# Patient Record
Sex: Female | Born: 1985 | Race: White | Hispanic: Yes | Marital: Married | Smoking: Former smoker
Health system: Southern US, Community
[De-identification: ages and names within clinical notes are randomized; demographics above are authoritative.]

## PROBLEM LIST (undated history)

## (undated) DIAGNOSIS — K589 Irritable bowel syndrome without diarrhea: Secondary | ICD-10-CM

## (undated) DIAGNOSIS — G43909 Migraine, unspecified, not intractable, without status migrainosus: Secondary | ICD-10-CM

## (undated) DIAGNOSIS — M87052 Idiopathic aseptic necrosis of left femur: Secondary | ICD-10-CM

## (undated) DIAGNOSIS — E559 Vitamin D deficiency, unspecified: Secondary | ICD-10-CM

## (undated) DIAGNOSIS — E538 Deficiency of other specified B group vitamins: Secondary | ICD-10-CM

## (undated) DIAGNOSIS — M222X2 Patellofemoral disorders, left knee: Secondary | ICD-10-CM

## (undated) DIAGNOSIS — I499 Cardiac arrhythmia, unspecified: Secondary | ICD-10-CM

## (undated) DIAGNOSIS — R109 Unspecified abdominal pain: Secondary | ICD-10-CM

## (undated) DIAGNOSIS — G5620 Lesion of ulnar nerve, unspecified upper limb: Secondary | ICD-10-CM

## (undated) DIAGNOSIS — M797 Fibromyalgia: Secondary | ICD-10-CM

## (undated) DIAGNOSIS — E785 Hyperlipidemia, unspecified: Secondary | ICD-10-CM

## (undated) DIAGNOSIS — M1990 Unspecified osteoarthritis, unspecified site: Secondary | ICD-10-CM

## (undated) DIAGNOSIS — Z87442 Personal history of urinary calculi: Secondary | ICD-10-CM

## (undated) DIAGNOSIS — J189 Pneumonia, unspecified organism: Secondary | ICD-10-CM

## (undated) DIAGNOSIS — M222X1 Patellofemoral disorders, right knee: Secondary | ICD-10-CM

## (undated) DIAGNOSIS — M5481 Occipital neuralgia: Secondary | ICD-10-CM

## (undated) DIAGNOSIS — R413 Other amnesia: Secondary | ICD-10-CM

## (undated) DIAGNOSIS — K219 Gastro-esophageal reflux disease without esophagitis: Secondary | ICD-10-CM

## (undated) DIAGNOSIS — G4700 Insomnia, unspecified: Secondary | ICD-10-CM

## (undated) DIAGNOSIS — I951 Orthostatic hypotension: Secondary | ICD-10-CM

## (undated) DIAGNOSIS — I471 Supraventricular tachycardia: Secondary | ICD-10-CM

## (undated) DIAGNOSIS — N9419 Other specified dyspareunia: Secondary | ICD-10-CM

## (undated) DIAGNOSIS — M7580 Other shoulder lesions, unspecified shoulder: Secondary | ICD-10-CM

## (undated) DIAGNOSIS — N809 Endometriosis, unspecified: Secondary | ICD-10-CM

## (undated) DIAGNOSIS — N200 Calculus of kidney: Secondary | ICD-10-CM

## (undated) DIAGNOSIS — M357 Hypermobility syndrome: Secondary | ICD-10-CM

## (undated) DIAGNOSIS — Z8614 Personal history of Methicillin resistant Staphylococcus aureus infection: Secondary | ICD-10-CM

## (undated) DIAGNOSIS — G909 Disorder of the autonomic nervous system, unspecified: Secondary | ICD-10-CM

## (undated) DIAGNOSIS — C801 Malignant (primary) neoplasm, unspecified: Secondary | ICD-10-CM

## (undated) HISTORY — DX: Patellofemoral disorders, right knee: M22.2X1

## (undated) HISTORY — DX: Unspecified abdominal pain: R10.9

## (undated) HISTORY — DX: Calculus of kidney: N20.0

## (undated) HISTORY — DX: Insomnia, unspecified: G47.00

## (undated) HISTORY — PX: WISDOM TOOTH EXTRACTION: SHX21

## (undated) HISTORY — DX: Migraine, unspecified, not intractable, without status migrainosus: G43.909

## (undated) HISTORY — DX: Other shoulder lesions, unspecified shoulder: M75.80

## (undated) HISTORY — DX: Gastro-esophageal reflux disease without esophagitis: K21.9

## (undated) HISTORY — DX: Patellofemoral disorders, left knee: M22.2X2

## (undated) HISTORY — PX: OTHER SURGICAL HISTORY: SHX169

## (undated) HISTORY — PX: ABDOMINAL SURGERY: SHX537

## (undated) HISTORY — DX: Lesion of ulnar nerve, unspecified upper limb: G56.20

## (undated) HISTORY — PX: TYMPANOPLASTY: SHX33

## (undated) HISTORY — DX: Deficiency of other specified B group vitamins: E53.8

## (undated) HISTORY — DX: Other amnesia: R41.3

## (undated) HISTORY — DX: Hyperlipidemia, unspecified: E78.5

## (undated) HISTORY — DX: Vitamin D deficiency, unspecified: E55.9

## (undated) HISTORY — DX: Occipital neuralgia: M54.81

## (undated) HISTORY — DX: Endometriosis, unspecified: N80.9

## (undated) HISTORY — DX: Supraventricular tachycardia: I47.1

## (undated) HISTORY — PX: TUBAL LIGATION: SHX77

## (undated) HISTORY — PX: PORTA CATH INSERTION: CATH118285

## (undated) HISTORY — DX: Other specified dyspareunia: N94.19

## (undated) HISTORY — DX: Irritable bowel syndrome, unspecified: K58.9

---

## 2010-05-13 HISTORY — PX: PELVIC LAPAROSCOPY: SHX162

## 2011-05-14 HISTORY — PX: NASAL SEPTUM SURGERY: SHX37

## 2011-05-20 DIAGNOSIS — R002 Palpitations: Secondary | ICD-10-CM | POA: Insufficient documentation

## 2011-05-20 DIAGNOSIS — R079 Chest pain, unspecified: Secondary | ICD-10-CM | POA: Insufficient documentation

## 2011-05-20 DIAGNOSIS — R0602 Shortness of breath: Secondary | ICD-10-CM | POA: Insufficient documentation

## 2011-10-23 DIAGNOSIS — N971 Female infertility of tubal origin: Secondary | ICD-10-CM | POA: Insufficient documentation

## 2012-10-29 DIAGNOSIS — N200 Calculus of kidney: Secondary | ICD-10-CM | POA: Insufficient documentation

## 2014-09-07 DIAGNOSIS — K760 Fatty (change of) liver, not elsewhere classified: Secondary | ICD-10-CM | POA: Insufficient documentation

## 2015-05-14 DIAGNOSIS — R109 Unspecified abdominal pain: Secondary | ICD-10-CM

## 2015-05-14 DIAGNOSIS — I4719 Other supraventricular tachycardia: Secondary | ICD-10-CM

## 2015-05-14 DIAGNOSIS — G5620 Lesion of ulnar nerve, unspecified upper limb: Secondary | ICD-10-CM

## 2015-05-14 DIAGNOSIS — M5481 Occipital neuralgia: Secondary | ICD-10-CM

## 2015-05-14 DIAGNOSIS — I471 Supraventricular tachycardia: Secondary | ICD-10-CM

## 2015-05-14 DIAGNOSIS — M7580 Other shoulder lesions, unspecified shoulder: Secondary | ICD-10-CM

## 2015-05-14 HISTORY — DX: Other supraventricular tachycardia: I47.19

## 2015-05-14 HISTORY — DX: Other shoulder lesions, unspecified shoulder: M75.80

## 2015-05-14 HISTORY — DX: Supraventricular tachycardia: I47.1

## 2015-05-14 HISTORY — DX: Lesion of ulnar nerve, unspecified upper limb: G56.20

## 2015-05-14 HISTORY — DX: Unspecified abdominal pain: R10.9

## 2015-05-14 HISTORY — DX: Occipital neuralgia: M54.81

## 2015-07-05 DIAGNOSIS — E785 Hyperlipidemia, unspecified: Secondary | ICD-10-CM | POA: Insufficient documentation

## 2015-07-05 DIAGNOSIS — I471 Supraventricular tachycardia: Secondary | ICD-10-CM | POA: Insufficient documentation

## 2015-07-05 DIAGNOSIS — K219 Gastro-esophageal reflux disease without esophagitis: Secondary | ICD-10-CM | POA: Insufficient documentation

## 2015-07-05 DIAGNOSIS — K589 Irritable bowel syndrome without diarrhea: Secondary | ICD-10-CM | POA: Insufficient documentation

## 2015-07-05 DIAGNOSIS — N809 Endometriosis, unspecified: Secondary | ICD-10-CM | POA: Insufficient documentation

## 2015-07-05 DIAGNOSIS — J45909 Unspecified asthma, uncomplicated: Secondary | ICD-10-CM | POA: Insufficient documentation

## 2015-07-05 DIAGNOSIS — R911 Solitary pulmonary nodule: Secondary | ICD-10-CM | POA: Insufficient documentation

## 2015-07-05 DIAGNOSIS — M129 Arthropathy, unspecified: Secondary | ICD-10-CM | POA: Insufficient documentation

## 2015-07-05 DIAGNOSIS — F419 Anxiety disorder, unspecified: Secondary | ICD-10-CM | POA: Insufficient documentation

## 2015-07-07 DIAGNOSIS — M25562 Pain in left knee: Secondary | ICD-10-CM

## 2015-07-07 DIAGNOSIS — G8929 Other chronic pain: Secondary | ICD-10-CM | POA: Insufficient documentation

## 2015-08-10 DIAGNOSIS — J019 Acute sinusitis, unspecified: Secondary | ICD-10-CM | POA: Insufficient documentation

## 2015-08-13 DIAGNOSIS — S8002XA Contusion of left knee, initial encounter: Secondary | ICD-10-CM | POA: Diagnosis not present

## 2015-08-15 DIAGNOSIS — M25511 Pain in right shoulder: Secondary | ICD-10-CM | POA: Diagnosis not present

## 2015-08-15 DIAGNOSIS — G5621 Lesion of ulnar nerve, right upper limb: Secondary | ICD-10-CM | POA: Diagnosis not present

## 2015-08-15 DIAGNOSIS — M25562 Pain in left knee: Secondary | ICD-10-CM | POA: Diagnosis not present

## 2015-08-15 DIAGNOSIS — G8929 Other chronic pain: Secondary | ICD-10-CM | POA: Diagnosis not present

## 2015-08-17 DIAGNOSIS — R918 Other nonspecific abnormal finding of lung field: Secondary | ICD-10-CM | POA: Diagnosis not present

## 2015-08-21 DIAGNOSIS — M25522 Pain in left elbow: Secondary | ICD-10-CM | POA: Diagnosis not present

## 2015-08-23 DIAGNOSIS — S5002XA Contusion of left elbow, initial encounter: Secondary | ICD-10-CM | POA: Diagnosis not present

## 2015-10-26 DIAGNOSIS — R112 Nausea with vomiting, unspecified: Secondary | ICD-10-CM | POA: Diagnosis not present

## 2015-10-26 DIAGNOSIS — M545 Low back pain: Secondary | ICD-10-CM | POA: Diagnosis not present

## 2015-10-26 DIAGNOSIS — N12 Tubulo-interstitial nephritis, not specified as acute or chronic: Secondary | ICD-10-CM | POA: Diagnosis not present

## 2015-10-26 DIAGNOSIS — R300 Dysuria: Secondary | ICD-10-CM | POA: Diagnosis not present

## 2015-10-29 DIAGNOSIS — B9689 Other specified bacterial agents as the cause of diseases classified elsewhere: Secondary | ICD-10-CM | POA: Diagnosis not present

## 2015-10-29 DIAGNOSIS — J45909 Unspecified asthma, uncomplicated: Secondary | ICD-10-CM | POA: Diagnosis not present

## 2015-10-29 DIAGNOSIS — M797 Fibromyalgia: Secondary | ICD-10-CM | POA: Diagnosis not present

## 2015-10-29 DIAGNOSIS — I4589 Other specified conduction disorders: Secondary | ICD-10-CM | POA: Diagnosis not present

## 2015-10-29 DIAGNOSIS — J329 Chronic sinusitis, unspecified: Secondary | ICD-10-CM | POA: Diagnosis not present

## 2015-10-29 DIAGNOSIS — Z91018 Allergy to other foods: Secondary | ICD-10-CM | POA: Diagnosis not present

## 2015-10-29 DIAGNOSIS — R1031 Right lower quadrant pain: Secondary | ICD-10-CM | POA: Diagnosis not present

## 2015-10-29 DIAGNOSIS — R300 Dysuria: Secondary | ICD-10-CM | POA: Diagnosis not present

## 2015-10-29 DIAGNOSIS — B373 Candidiasis of vulva and vagina: Secondary | ICD-10-CM | POA: Diagnosis not present

## 2015-10-29 DIAGNOSIS — N760 Acute vaginitis: Secondary | ICD-10-CM | POA: Diagnosis not present

## 2015-10-29 DIAGNOSIS — K589 Irritable bowel syndrome without diarrhea: Secondary | ICD-10-CM | POA: Diagnosis not present

## 2015-10-29 DIAGNOSIS — Z91048 Other nonmedicinal substance allergy status: Secondary | ICD-10-CM | POA: Diagnosis not present

## 2015-10-29 DIAGNOSIS — N200 Calculus of kidney: Secondary | ICD-10-CM | POA: Diagnosis not present

## 2015-10-29 DIAGNOSIS — Z793 Long term (current) use of hormonal contraceptives: Secondary | ICD-10-CM | POA: Diagnosis not present

## 2015-10-29 DIAGNOSIS — E785 Hyperlipidemia, unspecified: Secondary | ICD-10-CM | POA: Diagnosis not present

## 2015-10-29 DIAGNOSIS — F329 Major depressive disorder, single episode, unspecified: Secondary | ICD-10-CM | POA: Diagnosis not present

## 2015-10-29 DIAGNOSIS — Z87891 Personal history of nicotine dependence: Secondary | ICD-10-CM | POA: Diagnosis not present

## 2015-10-29 DIAGNOSIS — F419 Anxiety disorder, unspecified: Secondary | ICD-10-CM | POA: Diagnosis not present

## 2015-12-04 DIAGNOSIS — R109 Unspecified abdominal pain: Secondary | ICD-10-CM | POA: Diagnosis not present

## 2015-12-04 DIAGNOSIS — R5383 Other fatigue: Secondary | ICD-10-CM | POA: Diagnosis not present

## 2015-12-04 DIAGNOSIS — R300 Dysuria: Secondary | ICD-10-CM | POA: Diagnosis not present

## 2015-12-07 DIAGNOSIS — R81 Glycosuria: Secondary | ICD-10-CM | POA: Diagnosis not present

## 2015-12-07 DIAGNOSIS — K7689 Other specified diseases of liver: Secondary | ICD-10-CM | POA: Diagnosis not present

## 2015-12-07 DIAGNOSIS — Z0000 Encounter for general adult medical examination without abnormal findings: Secondary | ICD-10-CM | POA: Diagnosis not present

## 2015-12-07 DIAGNOSIS — R300 Dysuria: Secondary | ICD-10-CM | POA: Diagnosis not present

## 2015-12-18 DIAGNOSIS — M898X8 Other specified disorders of bone, other site: Secondary | ICD-10-CM | POA: Diagnosis not present

## 2015-12-18 DIAGNOSIS — G8929 Other chronic pain: Secondary | ICD-10-CM | POA: Diagnosis not present

## 2015-12-18 DIAGNOSIS — M25561 Pain in right knee: Secondary | ICD-10-CM | POA: Diagnosis not present

## 2015-12-18 DIAGNOSIS — M222X1 Patellofemoral disorders, right knee: Secondary | ICD-10-CM | POA: Diagnosis not present

## 2015-12-18 DIAGNOSIS — M222X2 Patellofemoral disorders, left knee: Secondary | ICD-10-CM | POA: Diagnosis not present

## 2015-12-18 DIAGNOSIS — M25522 Pain in left elbow: Secondary | ICD-10-CM | POA: Diagnosis not present

## 2015-12-18 DIAGNOSIS — M25562 Pain in left knee: Secondary | ICD-10-CM | POA: Diagnosis not present

## 2016-01-22 DIAGNOSIS — R1031 Right lower quadrant pain: Secondary | ICD-10-CM | POA: Diagnosis not present

## 2016-01-22 DIAGNOSIS — Z793 Long term (current) use of hormonal contraceptives: Secondary | ICD-10-CM | POA: Diagnosis not present

## 2016-01-22 DIAGNOSIS — S0993XA Unspecified injury of face, initial encounter: Secondary | ICD-10-CM | POA: Diagnosis not present

## 2016-01-22 DIAGNOSIS — Z91048 Other nonmedicinal substance allergy status: Secondary | ICD-10-CM | POA: Diagnosis not present

## 2016-01-22 DIAGNOSIS — R109 Unspecified abdominal pain: Secondary | ICD-10-CM | POA: Diagnosis not present

## 2016-01-22 DIAGNOSIS — S0083XA Contusion of other part of head, initial encounter: Secondary | ICD-10-CM | POA: Diagnosis not present

## 2016-01-22 DIAGNOSIS — J45909 Unspecified asthma, uncomplicated: Secondary | ICD-10-CM | POA: Diagnosis not present

## 2016-01-22 DIAGNOSIS — S8992XA Unspecified injury of left lower leg, initial encounter: Secondary | ICD-10-CM | POA: Diagnosis not present

## 2016-01-22 DIAGNOSIS — Z91018 Allergy to other foods: Secondary | ICD-10-CM | POA: Diagnosis not present

## 2016-01-22 DIAGNOSIS — Q613 Polycystic kidney, unspecified: Secondary | ICD-10-CM | POA: Diagnosis not present

## 2016-01-22 DIAGNOSIS — N200 Calculus of kidney: Secondary | ICD-10-CM | POA: Diagnosis not present

## 2016-01-22 DIAGNOSIS — S8392XA Sprain of unspecified site of left knee, initial encounter: Secondary | ICD-10-CM | POA: Diagnosis not present

## 2016-01-22 DIAGNOSIS — S0033XA Contusion of nose, initial encounter: Secondary | ICD-10-CM | POA: Diagnosis not present

## 2016-01-22 DIAGNOSIS — Z87891 Personal history of nicotine dependence: Secondary | ICD-10-CM | POA: Diagnosis not present

## 2016-01-22 DIAGNOSIS — Z79899 Other long term (current) drug therapy: Secondary | ICD-10-CM | POA: Diagnosis not present

## 2016-01-22 DIAGNOSIS — K589 Irritable bowel syndrome without diarrhea: Secondary | ICD-10-CM | POA: Diagnosis not present

## 2016-01-22 DIAGNOSIS — E785 Hyperlipidemia, unspecified: Secondary | ICD-10-CM | POA: Diagnosis not present

## 2016-01-24 DIAGNOSIS — N200 Calculus of kidney: Secondary | ICD-10-CM | POA: Diagnosis not present

## 2016-01-24 DIAGNOSIS — J45909 Unspecified asthma, uncomplicated: Secondary | ICD-10-CM | POA: Diagnosis not present

## 2016-01-24 DIAGNOSIS — R109 Unspecified abdominal pain: Secondary | ICD-10-CM | POA: Diagnosis not present

## 2016-01-24 DIAGNOSIS — Z79899 Other long term (current) drug therapy: Secondary | ICD-10-CM | POA: Diagnosis not present

## 2016-01-24 DIAGNOSIS — E785 Hyperlipidemia, unspecified: Secondary | ICD-10-CM | POA: Diagnosis not present

## 2016-01-24 DIAGNOSIS — F329 Major depressive disorder, single episode, unspecified: Secondary | ICD-10-CM | POA: Diagnosis not present

## 2016-01-24 DIAGNOSIS — N189 Chronic kidney disease, unspecified: Secondary | ICD-10-CM | POA: Diagnosis not present

## 2016-01-24 DIAGNOSIS — R1031 Right lower quadrant pain: Secondary | ICD-10-CM | POA: Diagnosis not present

## 2016-01-24 DIAGNOSIS — F419 Anxiety disorder, unspecified: Secondary | ICD-10-CM | POA: Diagnosis not present

## 2016-01-24 DIAGNOSIS — Z87891 Personal history of nicotine dependence: Secondary | ICD-10-CM | POA: Diagnosis not present

## 2016-01-24 DIAGNOSIS — I878 Other specified disorders of veins: Secondary | ICD-10-CM | POA: Diagnosis not present

## 2016-01-24 DIAGNOSIS — Z91018 Allergy to other foods: Secondary | ICD-10-CM | POA: Diagnosis not present

## 2016-01-24 DIAGNOSIS — N390 Urinary tract infection, site not specified: Secondary | ICD-10-CM | POA: Diagnosis not present

## 2016-01-29 DIAGNOSIS — R1084 Generalized abdominal pain: Secondary | ICD-10-CM | POA: Diagnosis not present

## 2016-01-29 DIAGNOSIS — N200 Calculus of kidney: Secondary | ICD-10-CM | POA: Diagnosis not present

## 2016-02-08 DIAGNOSIS — M545 Low back pain: Secondary | ICD-10-CM | POA: Diagnosis not present

## 2016-02-08 DIAGNOSIS — Z79899 Other long term (current) drug therapy: Secondary | ICD-10-CM | POA: Diagnosis not present

## 2016-02-08 DIAGNOSIS — Z91018 Allergy to other foods: Secondary | ICD-10-CM | POA: Diagnosis not present

## 2016-02-08 DIAGNOSIS — E785 Hyperlipidemia, unspecified: Secondary | ICD-10-CM | POA: Diagnosis not present

## 2016-02-08 DIAGNOSIS — S46912A Strain of unspecified muscle, fascia and tendon at shoulder and upper arm level, left arm, initial encounter: Secondary | ICD-10-CM | POA: Diagnosis not present

## 2016-02-08 DIAGNOSIS — M797 Fibromyalgia: Secondary | ICD-10-CM | POA: Diagnosis not present

## 2016-02-08 DIAGNOSIS — Z888 Allergy status to other drugs, medicaments and biological substances status: Secondary | ICD-10-CM | POA: Diagnosis not present

## 2016-02-08 DIAGNOSIS — J45909 Unspecified asthma, uncomplicated: Secondary | ICD-10-CM | POA: Diagnosis not present

## 2016-02-08 DIAGNOSIS — F329 Major depressive disorder, single episode, unspecified: Secondary | ICD-10-CM | POA: Diagnosis not present

## 2016-02-08 DIAGNOSIS — M25562 Pain in left knee: Secondary | ICD-10-CM | POA: Diagnosis not present

## 2016-02-08 DIAGNOSIS — Z87891 Personal history of nicotine dependence: Secondary | ICD-10-CM | POA: Diagnosis not present

## 2016-02-08 DIAGNOSIS — F419 Anxiety disorder, unspecified: Secondary | ICD-10-CM | POA: Diagnosis not present

## 2016-02-08 DIAGNOSIS — M25512 Pain in left shoulder: Secondary | ICD-10-CM | POA: Diagnosis not present

## 2016-02-08 DIAGNOSIS — N189 Chronic kidney disease, unspecified: Secondary | ICD-10-CM | POA: Diagnosis not present

## 2016-02-22 DIAGNOSIS — Z87891 Personal history of nicotine dependence: Secondary | ICD-10-CM | POA: Diagnosis not present

## 2016-02-22 DIAGNOSIS — M25562 Pain in left knee: Secondary | ICD-10-CM | POA: Diagnosis not present

## 2016-02-22 DIAGNOSIS — S83412A Sprain of medial collateral ligament of left knee, initial encounter: Secondary | ICD-10-CM | POA: Diagnosis not present

## 2016-02-28 DIAGNOSIS — N200 Calculus of kidney: Secondary | ICD-10-CM | POA: Diagnosis not present

## 2016-03-01 DIAGNOSIS — N200 Calculus of kidney: Secondary | ICD-10-CM | POA: Diagnosis not present

## 2016-03-13 DIAGNOSIS — M549 Dorsalgia, unspecified: Secondary | ICD-10-CM | POA: Diagnosis not present

## 2016-03-13 DIAGNOSIS — R51 Headache: Secondary | ICD-10-CM | POA: Diagnosis not present

## 2016-03-13 DIAGNOSIS — M542 Cervicalgia: Secondary | ICD-10-CM | POA: Diagnosis not present

## 2016-05-14 DIAGNOSIS — Q649 Congenital malformation of urinary system, unspecified: Secondary | ICD-10-CM | POA: Diagnosis not present

## 2016-05-14 DIAGNOSIS — R300 Dysuria: Secondary | ICD-10-CM | POA: Diagnosis not present

## 2016-05-14 DIAGNOSIS — J0100 Acute maxillary sinusitis, unspecified: Secondary | ICD-10-CM | POA: Diagnosis not present

## 2016-05-20 DIAGNOSIS — R300 Dysuria: Secondary | ICD-10-CM | POA: Diagnosis not present

## 2016-05-20 DIAGNOSIS — R350 Frequency of micturition: Secondary | ICD-10-CM | POA: Diagnosis not present

## 2016-05-20 DIAGNOSIS — J019 Acute sinusitis, unspecified: Secondary | ICD-10-CM | POA: Diagnosis not present

## 2016-10-29 DIAGNOSIS — M25512 Pain in left shoulder: Secondary | ICD-10-CM | POA: Diagnosis not present

## 2016-11-12 DIAGNOSIS — M62838 Other muscle spasm: Secondary | ICD-10-CM | POA: Diagnosis not present

## 2016-11-12 DIAGNOSIS — M542 Cervicalgia: Secondary | ICD-10-CM | POA: Diagnosis not present

## 2016-11-12 DIAGNOSIS — M25512 Pain in left shoulder: Secondary | ICD-10-CM | POA: Diagnosis not present

## 2016-11-16 DIAGNOSIS — M50322 Other cervical disc degeneration at C5-C6 level: Secondary | ICD-10-CM | POA: Diagnosis not present

## 2016-11-16 DIAGNOSIS — M25512 Pain in left shoulder: Secondary | ICD-10-CM | POA: Diagnosis not present

## 2016-11-16 DIAGNOSIS — M542 Cervicalgia: Secondary | ICD-10-CM | POA: Diagnosis not present

## 2016-11-16 DIAGNOSIS — M50321 Other cervical disc degeneration at C4-C5 level: Secondary | ICD-10-CM | POA: Diagnosis not present

## 2016-11-26 DIAGNOSIS — M5020 Other cervical disc displacement, unspecified cervical region: Secondary | ICD-10-CM | POA: Diagnosis not present

## 2016-11-26 DIAGNOSIS — M4722 Other spondylosis with radiculopathy, cervical region: Secondary | ICD-10-CM | POA: Diagnosis not present

## 2016-11-26 DIAGNOSIS — M542 Cervicalgia: Secondary | ICD-10-CM | POA: Diagnosis not present

## 2016-11-26 DIAGNOSIS — M4712 Other spondylosis with myelopathy, cervical region: Secondary | ICD-10-CM | POA: Diagnosis not present

## 2016-12-02 DIAGNOSIS — M5412 Radiculopathy, cervical region: Secondary | ICD-10-CM | POA: Diagnosis not present

## 2016-12-24 DIAGNOSIS — M5020 Other cervical disc displacement, unspecified cervical region: Secondary | ICD-10-CM | POA: Diagnosis not present

## 2016-12-24 DIAGNOSIS — M4722 Other spondylosis with radiculopathy, cervical region: Secondary | ICD-10-CM | POA: Diagnosis not present

## 2016-12-24 DIAGNOSIS — M4712 Other spondylosis with myelopathy, cervical region: Secondary | ICD-10-CM | POA: Diagnosis not present

## 2016-12-24 DIAGNOSIS — M542 Cervicalgia: Secondary | ICD-10-CM | POA: Diagnosis not present

## 2017-01-27 DIAGNOSIS — M4722 Other spondylosis with radiculopathy, cervical region: Secondary | ICD-10-CM | POA: Diagnosis not present

## 2017-01-27 DIAGNOSIS — M4712 Other spondylosis with myelopathy, cervical region: Secondary | ICD-10-CM | POA: Diagnosis not present

## 2017-02-18 DIAGNOSIS — M5412 Radiculopathy, cervical region: Secondary | ICD-10-CM | POA: Diagnosis not present

## 2017-02-18 DIAGNOSIS — M4712 Other spondylosis with myelopathy, cervical region: Secondary | ICD-10-CM | POA: Diagnosis not present

## 2017-02-18 DIAGNOSIS — M542 Cervicalgia: Secondary | ICD-10-CM | POA: Diagnosis not present

## 2017-02-18 DIAGNOSIS — M4722 Other spondylosis with radiculopathy, cervical region: Secondary | ICD-10-CM | POA: Diagnosis not present

## 2017-02-28 DIAGNOSIS — M542 Cervicalgia: Secondary | ICD-10-CM | POA: Diagnosis not present

## 2017-03-07 DIAGNOSIS — M542 Cervicalgia: Secondary | ICD-10-CM | POA: Diagnosis not present

## 2017-03-17 DIAGNOSIS — M4712 Other spondylosis with myelopathy, cervical region: Secondary | ICD-10-CM | POA: Diagnosis not present

## 2017-03-17 DIAGNOSIS — M4722 Other spondylosis with radiculopathy, cervical region: Secondary | ICD-10-CM | POA: Diagnosis not present

## 2017-04-12 DIAGNOSIS — M542 Cervicalgia: Secondary | ICD-10-CM | POA: Diagnosis not present

## 2017-04-12 DIAGNOSIS — M5412 Radiculopathy, cervical region: Secondary | ICD-10-CM | POA: Diagnosis not present

## 2017-05-08 DIAGNOSIS — J45909 Unspecified asthma, uncomplicated: Secondary | ICD-10-CM | POA: Diagnosis not present

## 2017-05-08 DIAGNOSIS — W19XXXA Unspecified fall, initial encounter: Secondary | ICD-10-CM | POA: Diagnosis not present

## 2017-05-08 DIAGNOSIS — M797 Fibromyalgia: Secondary | ICD-10-CM | POA: Diagnosis not present

## 2017-05-08 DIAGNOSIS — S93401A Sprain of unspecified ligament of right ankle, initial encounter: Secondary | ICD-10-CM | POA: Diagnosis not present

## 2017-05-08 DIAGNOSIS — S93491A Sprain of other ligament of right ankle, initial encounter: Secondary | ICD-10-CM | POA: Diagnosis not present

## 2017-05-08 DIAGNOSIS — Z87891 Personal history of nicotine dependence: Secondary | ICD-10-CM | POA: Diagnosis not present

## 2017-05-08 DIAGNOSIS — Z79899 Other long term (current) drug therapy: Secondary | ICD-10-CM | POA: Diagnosis not present

## 2017-05-08 DIAGNOSIS — M25571 Pain in right ankle and joints of right foot: Secondary | ICD-10-CM | POA: Diagnosis not present

## 2017-05-08 DIAGNOSIS — F419 Anxiety disorder, unspecified: Secondary | ICD-10-CM | POA: Diagnosis not present

## 2017-05-08 DIAGNOSIS — W07XXXA Fall from chair, initial encounter: Secondary | ICD-10-CM | POA: Diagnosis not present

## 2017-05-08 DIAGNOSIS — Z91018 Allergy to other foods: Secondary | ICD-10-CM | POA: Diagnosis not present

## 2017-05-08 DIAGNOSIS — M25561 Pain in right knee: Secondary | ICD-10-CM | POA: Diagnosis not present

## 2017-05-08 DIAGNOSIS — Z79890 Hormone replacement therapy (postmenopausal): Secondary | ICD-10-CM | POA: Diagnosis not present

## 2017-05-08 DIAGNOSIS — Z91048 Other nonmedicinal substance allergy status: Secondary | ICD-10-CM | POA: Diagnosis not present

## 2017-05-08 DIAGNOSIS — F329 Major depressive disorder, single episode, unspecified: Secondary | ICD-10-CM | POA: Diagnosis not present

## 2017-05-15 ENCOUNTER — Ambulatory Visit (INDEPENDENT_AMBULATORY_CARE_PROVIDER_SITE_OTHER): Payer: BLUE CROSS/BLUE SHIELD | Admitting: Family Medicine

## 2017-05-15 ENCOUNTER — Encounter: Payer: Self-pay | Admitting: Family Medicine

## 2017-05-15 DIAGNOSIS — S99911A Unspecified injury of right ankle, initial encounter: Secondary | ICD-10-CM | POA: Diagnosis not present

## 2017-05-15 DIAGNOSIS — S8991XA Unspecified injury of right lower leg, initial encounter: Secondary | ICD-10-CM

## 2017-05-15 NOTE — Patient Instructions (Signed)
You can stop wearing the ankle brace.  You have a severe knee contusion with an effusion - concern is for a possible ACL tear when this occurs. We will go ahead with an MRI to further assess. In meantime icing 15 minutes at a time 3-4 times a day. Ibuprofen 600mg  three times a day with food OR aleve 2 tabs twice a day with food for pain and inflammation. Knee brace for support. Elevate above your heart when possible. I will call you with results and next steps.

## 2017-05-20 ENCOUNTER — Encounter: Payer: Self-pay | Admitting: Family Medicine

## 2017-05-20 DIAGNOSIS — S8991XD Unspecified injury of right lower leg, subsequent encounter: Secondary | ICD-10-CM | POA: Insufficient documentation

## 2017-05-20 DIAGNOSIS — M797 Fibromyalgia: Secondary | ICD-10-CM | POA: Insufficient documentation

## 2017-05-20 DIAGNOSIS — S99911A Unspecified injury of right ankle, initial encounter: Secondary | ICD-10-CM | POA: Insufficient documentation

## 2017-05-20 NOTE — Assessment & Plan Note (Signed)
noted effusion confirmed with ultrasound.  Noncontact injury - exam consistent with knee contusion with effusion.  Discussed high incidence of ACL tears with acute effusion following injury though even with normal exam.  Will go ahead with MRI to further assess.  Icing, ibuprofen or aleve.  Knee brace for support.

## 2017-05-20 NOTE — Progress Notes (Signed)
PCP: Madelaine Bhat., MD  Subjective:   HPI: Patient is a 32 y.o. female here for right knee and ankle pain.  Patient reports on 12/26 she was stepping down from a folding chair and jammed her right leg on the ground injuring her right ankle and knee Feels the ankle has improved - wearing ankle brace. Knee still hurts at 6-7/10 level, sharp with swelling. Difficulty sleeping, associated spasms. 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 Has been resting and icing. No skin changes, numbness.  History reviewed. No pertinent past medical history.  Current Outpatient Medications on File Prior to Visit  Medication Sig Dispense Refill  . omeprazole (PRILOSEC) 20 MG capsule Take by mouth.    . traZODone (DESYREL) 50 MG tablet 1-3 tabs q hs    . cyclobenzaprine (FLEXERIL) 10 MG tablet Take by mouth.     No current facility-administered medications on file prior to visit.     History reviewed. No pertinent surgical history.  Allergies  Allergen Reactions  . Strawberry Extract Hives    Social History   Socioeconomic History  . Marital status: Married    Spouse name: Not on file  . Number of children: Not on file  . Years of education: Not on file  . Highest education level: Not on file  Social Needs  . Financial resource strain: Not on file  . Food insecurity - worry: Not on file  . Food insecurity - inability: Not on file  . Transportation needs - medical: Not on file  . Transportation needs - non-medical: Not on file  Occupational History  . Not on file  Tobacco Use  . Smoking status: Never Smoker  . Smokeless tobacco: Never Used  Substance and Sexual Activity  . Alcohol use: Not on file  . Drug use: Not on file  . Sexual activity: Not on file  Other Topics Concern  . Not on file  Social History Narrative  . Not on file    History reviewed. No pertinent family history.  BP 115/76   Pulse 69   Ht 5\' 8"  (1.727 m)    Wt 147 lb (66.7 kg)   BMI 22.35 kg/m   Review of Systems: See HPI above.     Objective:  Physical Exam:  Gen: NAD, comfortable in exam room  Right knee: Mod effusion.  No bruising, other deformity. No TTP. ROM 0 - 120 degrees. Negative ant/post drawers. Negative valgus/varus testing. Negative lachmanns. Negative mcmurrays, apleys, patellar apprehension. NV intact distally.  Left knee: No gross deformity, ecchymoses, swelling. No TTP. FROM. Negative ant/post drawers. Negative valgus/varus testing. Negative lachmanns. Negative mcmurrays, apleys, patellar apprehension. NV intact distally.  Right ankle: No gross deformity, swelling, ecchymoses FROM with 5/5 strength. No TTP Negative ant drawer and talar tilt.   Negative syndesmotic compression. Thompsons test negative. NV intact distally.   Assessment & Plan:  1. Right knee injury - noted effusion confirmed with ultrasound.  Noncontact injury - exam consistent with knee contusion with effusion.  Discussed high incidence of ACL tears with acute effusion following injury though even with normal exam.  Will go ahead with MRI to further assess.  Icing, ibuprofen or aleve.  Knee brace for support.   2. Right ankle injury - exam reassuring - stop using brace.

## 2017-05-20 NOTE — Assessment & Plan Note (Signed)
exam reassuring - stop using brace.

## 2017-05-23 NOTE — Addendum Note (Signed)
Addended by: Sherrie George F on: 05/23/2017 12:35 PM   Modules accepted: Orders

## 2017-06-02 ENCOUNTER — Other Ambulatory Visit: Payer: BLUE CROSS/BLUE SHIELD

## 2017-06-17 DIAGNOSIS — S93602A Unspecified sprain of left foot, initial encounter: Secondary | ICD-10-CM | POA: Diagnosis not present

## 2017-06-19 ENCOUNTER — Ambulatory Visit (INDEPENDENT_AMBULATORY_CARE_PROVIDER_SITE_OTHER): Payer: BLUE CROSS/BLUE SHIELD | Admitting: Family Medicine

## 2017-06-19 ENCOUNTER — Encounter: Payer: Self-pay | Admitting: Family Medicine

## 2017-06-19 DIAGNOSIS — M79672 Pain in left foot: Secondary | ICD-10-CM | POA: Diagnosis not present

## 2017-06-19 NOTE — Assessment & Plan Note (Signed)
with swelling and bruising but unusual in that she does not recall an injury.  Exam and ultrasound consistent with small cuboid fracture.  Cam walker when up and walking around.  Weight bearing as tolerated.  Tylenol, ibuprofen.  Crutches if needed.  F/u in 2 weeks.  Expect 6 weeks for this to heal.

## 2017-06-19 NOTE — Patient Instructions (Signed)
You have a small irregularity of your cuboid suggestive of a fracture. These heal really well with conservative treatment over 6 weeks. Boot when up and walking around but ok to take this off to wash the area, ice it. Tylenol 500mg  1-2 tabs three times a day if needed. Ibuprofen 600mg  three times a day with food for pain and inflammation. It's ok to put weight on this in the boot. Can use crutches if needed as well. Follow up with me in 2 weeks for reevaluation.

## 2017-06-19 NOTE — Progress Notes (Signed)
PCP: Madelaine Bhat., MD  Subjective:   HPI: Patient is a 32 y.o. female here for left foot pain.  Patient denies known injury or trauma. She reports on 2/1 she woke up with pain, swelling, bruising dorsal left foot and anterolateral ankle. She had x-rays at an urgent care that were negative for fracture. Wearing postop shoe. Pain is 6/10 and sharp, worse with walking. No other skin changes, numbness.  History reviewed. No pertinent past medical history.  Current Outpatient Medications on File Prior to Visit  Medication Sig Dispense Refill  . cyclobenzaprine (FLEXERIL) 10 MG tablet Take by mouth.    Marland Kitchen omeprazole (PRILOSEC) 20 MG capsule Take by mouth.    . traZODone (DESYREL) 50 MG tablet 1-3 tabs q hs     No current facility-administered medications on file prior to visit.     History reviewed. No pertinent surgical history.  Allergies  Allergen Reactions  . Strawberry Extract Hives    Social History   Socioeconomic History  . Marital status: Married    Spouse name: Not on file  . Number of children: Not on file  . Years of education: Not on file  . Highest education level: Not on file  Social Needs  . Financial resource strain: Not on file  . Food insecurity - worry: Not on file  . Food insecurity - inability: Not on file  . Transportation needs - medical: Not on file  . Transportation needs - non-medical: Not on file  Occupational History  . Not on file  Tobacco Use  . Smoking status: Never Smoker  . Smokeless tobacco: Never Used  Substance and Sexual Activity  . Alcohol use: Not on file  . Drug use: Not on file  . Sexual activity: Not on file  Other Topics Concern  . Not on file  Social History Narrative  . Not on file    History reviewed. No pertinent family history.  BP 140/87   Pulse (!) 104   Ht 5\' 8"  (1.727 m)   Wt 156 lb (70.8 kg)   BMI 23.72 kg/m   Review of Systems: See HPI above.     Objective:  Physical Exam:  Gen: NAD,  comfortable in exam room  Left foot/ankle: Mild swelling dorsal foot and lateral ankle.  Bruising as well over these areas into left lateral heel and 4th, 5th digits. FROM ankle with 5/5 strength. TTP over cuboid, bases of 4th and 5th metatarsals. Negative ant drawer and talar tilt.   Negative syndesmotic compression. Painful metatarsal squeeze. Thompsons test negative. NV intact distally.  Right foot/ankle: No deformity. FROM with 5/5 strength. No tenderness to palpation. NVI distally.  MSK u/s left foot/ankle: 4th and 5th metatarsals normal without cortical irregularities, edema, neovascularity.  Peroneus brevis intact without abnormalities.  Small irregularity of dorsal cuboid corresponding to her pain with edema.  No neovascularity.  Assessment & Plan:  1. Left foot pain - with swelling and bruising but unusual in that she does not recall an injury.  Exam and ultrasound consistent with small cuboid fracture.  Cam walker when up and walking around.  Weight bearing as tolerated.  Tylenol, ibuprofen.  Crutches if needed.  F/u in 2 weeks.  Expect 6 weeks for this to heal.

## 2017-07-03 ENCOUNTER — Ambulatory Visit (INDEPENDENT_AMBULATORY_CARE_PROVIDER_SITE_OTHER): Payer: BLUE CROSS/BLUE SHIELD | Admitting: Family Medicine

## 2017-07-03 ENCOUNTER — Encounter: Payer: Self-pay | Admitting: Family Medicine

## 2017-07-03 DIAGNOSIS — M79672 Pain in left foot: Secondary | ICD-10-CM | POA: Diagnosis not present

## 2017-07-03 NOTE — Patient Instructions (Signed)
Continue with the boot as you have been for 4 more weeks. Tylenol, ibuprofen if needed. Icing, elevation to help with the swelling and bruising. Follow up with me in 4 weeks for reevaluation. It's ok to put weight on this in the boot.

## 2017-07-03 NOTE — Assessment & Plan Note (Signed)
consistent with small dorsal cuboid fracture.  Continue with cam walker for 4 more weeks and follow up at that time.  Tylenol, ibuprofen if needed.  Icing, elevation.

## 2017-07-03 NOTE — Progress Notes (Signed)
PCP: Madelaine Bhat., MD  Subjective:   HPI: Patient is a 32 y.o. female here for left foot pain.  2/7: Patient denies known injury or trauma. She reports on 2/1 she woke up with pain, swelling, bruising dorsal left foot and anterolateral ankle. She had x-rays at an urgent care that were negative for fracture. Wearing postop shoe. Pain is 6/10 and sharp, worse with walking. No other skin changes, numbness.  2/21: Patient reports she's some better than last visit. She's wearing the boot with walking. Taking tylenol and icing this foot at night. Still has some swelling in area of pain. Pain level 6/10 and sharp. No skin changes, numbness.  History reviewed. No pertinent past medical history.  Current Outpatient Medications on File Prior to Visit  Medication Sig Dispense Refill  . cyclobenzaprine (FLEXERIL) 10 MG tablet Take by mouth.    Marland Kitchen omeprazole (PRILOSEC) 20 MG capsule Take by mouth.    . traZODone (DESYREL) 50 MG tablet 1-3 tabs q hs     No current facility-administered medications on file prior to visit.     History reviewed. No pertinent surgical history.  Allergies  Allergen Reactions  . Strawberry Extract Hives    Social History   Socioeconomic History  . Marital status: Married    Spouse name: Not on file  . Number of children: Not on file  . Years of education: Not on file  . Highest education level: Not on file  Social Needs  . Financial resource strain: Not on file  . Food insecurity - worry: Not on file  . Food insecurity - inability: Not on file  . Transportation needs - medical: Not on file  . Transportation needs - non-medical: Not on file  Occupational History  . Not on file  Tobacco Use  . Smoking status: Never Smoker  . Smokeless tobacco: Never Used  Substance and Sexual Activity  . Alcohol use: Not on file  . Drug use: Not on file  . Sexual activity: Not on file  Other Topics Concern  . Not on file  Social History Narrative  .  Not on file    History reviewed. No pertinent family history.  BP 105/61   Pulse 84   Ht 5\' 8"  (1.727 m)   Wt 155 lb (70.3 kg)   BMI 23.57 kg/m   Review of Systems: See HPI above.     Objective:  Physical Exam:  Gen: NAD, comfortable in exam room.  Left foot/ankle: Bruising into 2nd-4th digits.  Mild swelling proximal dorsolateral foot.  No other deformity. FROM ankle with 5/5 strength. TTP over cuboid, less over base of 4th, 5th metatarsals. Negative ant drawer and talar tilt.   Negative syndesmotic compression. Thompsons test negative. NV intact distally.  MSK u/s left foot/ankle: Again noted dorsal cuboid irregularity with edema overlying cortex of this.  No neovascularity.  4th and 5th metatarsals normal.  Assessment & Plan:  1. Left foot pain - consistent with small dorsal cuboid fracture.  Continue with cam walker for 4 more weeks and follow up at that time.  Tylenol, ibuprofen if needed.  Icing, elevation.

## 2017-07-25 ENCOUNTER — Ambulatory Visit: Payer: BLUE CROSS/BLUE SHIELD | Admitting: Family Medicine

## 2017-07-31 ENCOUNTER — Ambulatory Visit: Payer: BLUE CROSS/BLUE SHIELD | Admitting: Family Medicine

## 2017-08-13 DIAGNOSIS — M4722 Other spondylosis with radiculopathy, cervical region: Secondary | ICD-10-CM | POA: Diagnosis not present

## 2017-08-20 DIAGNOSIS — K219 Gastro-esophageal reflux disease without esophagitis: Secondary | ICD-10-CM | POA: Diagnosis not present

## 2017-08-20 DIAGNOSIS — Z87891 Personal history of nicotine dependence: Secondary | ICD-10-CM | POA: Diagnosis not present

## 2017-08-20 DIAGNOSIS — E785 Hyperlipidemia, unspecified: Secondary | ICD-10-CM | POA: Diagnosis not present

## 2017-08-20 DIAGNOSIS — Z981 Arthrodesis status: Secondary | ICD-10-CM | POA: Diagnosis not present

## 2017-08-20 DIAGNOSIS — Z791 Long term (current) use of non-steroidal anti-inflammatories (NSAID): Secondary | ICD-10-CM | POA: Diagnosis not present

## 2017-08-20 DIAGNOSIS — M4722 Other spondylosis with radiculopathy, cervical region: Secondary | ICD-10-CM | POA: Diagnosis not present

## 2017-08-20 DIAGNOSIS — Z91048 Other nonmedicinal substance allergy status: Secondary | ICD-10-CM | POA: Diagnosis not present

## 2017-08-20 DIAGNOSIS — M797 Fibromyalgia: Secondary | ICD-10-CM | POA: Diagnosis not present

## 2017-08-20 DIAGNOSIS — K760 Fatty (change of) liver, not elsewhere classified: Secondary | ICD-10-CM | POA: Diagnosis not present

## 2017-08-20 DIAGNOSIS — M50222 Other cervical disc displacement at C5-C6 level: Secondary | ICD-10-CM | POA: Diagnosis not present

## 2017-08-20 DIAGNOSIS — M5020 Other cervical disc displacement, unspecified cervical region: Secondary | ICD-10-CM | POA: Diagnosis not present

## 2017-08-20 DIAGNOSIS — F419 Anxiety disorder, unspecified: Secondary | ICD-10-CM | POA: Diagnosis not present

## 2017-08-20 DIAGNOSIS — Z4789 Encounter for other orthopedic aftercare: Secondary | ICD-10-CM | POA: Diagnosis not present

## 2017-08-20 DIAGNOSIS — M47812 Spondylosis without myelopathy or radiculopathy, cervical region: Secondary | ICD-10-CM | POA: Diagnosis not present

## 2017-08-20 DIAGNOSIS — Z91018 Allergy to other foods: Secondary | ICD-10-CM | POA: Diagnosis not present

## 2017-08-20 DIAGNOSIS — Z79899 Other long term (current) drug therapy: Secondary | ICD-10-CM | POA: Diagnosis not present

## 2017-08-20 DIAGNOSIS — Z87442 Personal history of urinary calculi: Secondary | ICD-10-CM | POA: Diagnosis not present

## 2017-08-21 DIAGNOSIS — Z91048 Other nonmedicinal substance allergy status: Secondary | ICD-10-CM | POA: Diagnosis not present

## 2017-08-21 DIAGNOSIS — Z91018 Allergy to other foods: Secondary | ICD-10-CM | POA: Diagnosis not present

## 2017-08-21 DIAGNOSIS — K219 Gastro-esophageal reflux disease without esophagitis: Secondary | ICD-10-CM | POA: Diagnosis not present

## 2017-08-21 DIAGNOSIS — F419 Anxiety disorder, unspecified: Secondary | ICD-10-CM | POA: Diagnosis not present

## 2017-08-21 DIAGNOSIS — M797 Fibromyalgia: Secondary | ICD-10-CM | POA: Diagnosis not present

## 2017-08-21 DIAGNOSIS — M47812 Spondylosis without myelopathy or radiculopathy, cervical region: Secondary | ICD-10-CM | POA: Diagnosis not present

## 2017-08-21 DIAGNOSIS — K760 Fatty (change of) liver, not elsewhere classified: Secondary | ICD-10-CM | POA: Diagnosis not present

## 2017-08-21 DIAGNOSIS — Z791 Long term (current) use of non-steroidal anti-inflammatories (NSAID): Secondary | ICD-10-CM | POA: Diagnosis not present

## 2017-08-21 DIAGNOSIS — M50222 Other cervical disc displacement at C5-C6 level: Secondary | ICD-10-CM | POA: Diagnosis not present

## 2017-08-21 DIAGNOSIS — Z87442 Personal history of urinary calculi: Secondary | ICD-10-CM | POA: Diagnosis not present

## 2017-08-21 DIAGNOSIS — E785 Hyperlipidemia, unspecified: Secondary | ICD-10-CM | POA: Diagnosis not present

## 2017-08-21 DIAGNOSIS — Z87891 Personal history of nicotine dependence: Secondary | ICD-10-CM | POA: Diagnosis not present

## 2017-08-21 DIAGNOSIS — Z79899 Other long term (current) drug therapy: Secondary | ICD-10-CM | POA: Diagnosis not present

## 2017-11-04 DIAGNOSIS — F419 Anxiety disorder, unspecified: Secondary | ICD-10-CM | POA: Diagnosis not present

## 2017-11-04 DIAGNOSIS — M797 Fibromyalgia: Secondary | ICD-10-CM | POA: Diagnosis not present

## 2017-11-04 DIAGNOSIS — R269 Unspecified abnormalities of gait and mobility: Secondary | ICD-10-CM | POA: Diagnosis not present

## 2017-11-04 DIAGNOSIS — Z791 Long term (current) use of non-steroidal anti-inflammatories (NSAID): Secondary | ICD-10-CM | POA: Diagnosis not present

## 2017-11-04 DIAGNOSIS — M25561 Pain in right knee: Secondary | ICD-10-CM | POA: Diagnosis not present

## 2017-11-04 DIAGNOSIS — S8992XA Unspecified injury of left lower leg, initial encounter: Secondary | ICD-10-CM | POA: Diagnosis not present

## 2017-11-04 DIAGNOSIS — Z9851 Tubal ligation status: Secondary | ICD-10-CM | POA: Diagnosis not present

## 2017-11-05 ENCOUNTER — Ambulatory Visit (INDEPENDENT_AMBULATORY_CARE_PROVIDER_SITE_OTHER): Payer: BLUE CROSS/BLUE SHIELD | Admitting: Family Medicine

## 2017-11-05 VITALS — BP 140/88 | Ht 68.0 in | Wt 157.0 lb

## 2017-11-05 DIAGNOSIS — S8991XA Unspecified injury of right lower leg, initial encounter: Secondary | ICD-10-CM

## 2017-11-05 MED ORDER — OXYCODONE-ACETAMINOPHEN 5-325 MG PO TABS: 1.0000 | ORAL_TABLET | ORAL | 0 refills | Status: DC | PRN

## 2017-11-05 NOTE — Patient Instructions (Signed)
I'm concerned you have a torn ACL and medial meniscus. We will go ahead with an MRI of your knee. Wear knee brace when up and walking around. Crutches if needed. Icing 15 minutes at a time 3-4 times a day. Take your diclofenac 75mg  twice a day with food for pain and inflammation. Percocet as needed for severe pain (no driving or working on this medicine). Follow up and next steps will depend on the MRI.

## 2017-11-06 ENCOUNTER — Telehealth: Payer: Self-pay | Admitting: Family Medicine

## 2017-11-06 ENCOUNTER — Encounter: Payer: Self-pay | Admitting: Family Medicine

## 2017-11-06 NOTE — Telephone Encounter (Signed)
Ok for her to have a temporary 3 month placard.  Thanks!

## 2017-11-06 NOTE — Telephone Encounter (Signed)
Patient called requesting a temporary handicap placard

## 2017-11-06 NOTE — Telephone Encounter (Signed)
Form ready for patient to pick up.

## 2017-11-06 NOTE — Progress Notes (Signed)
PCP: Madelaine Bhat., MD  Subjective:   HPI: Patient is a 32 y.o. female here for right knee.  1/3: Patient reports on 12/26 she was stepping down from a folding chair and jammed her right leg on the ground injuring her right ankle and knee Feels the ankle has improved - wearing ankle brace. Knee still hurts at 6-7/10 level, sharp with swelling. Difficulty sleeping, associated spasms. 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 Has been resting and icing. No skin changes, numbness.  6/26: Patient reports  Unfortunately she slipped and fell at home, twisted her right knee. She did not get imaging after last visit in January due to cost. She continued to struggle with problems with this knee acutely exacerbated with this recent fall. Difficulty bearing weight. Using crutches, icing, taking pain medication. No skin changes. Knee feels unstable.  No past medical history on file.  Current Outpatient Medications on File Prior to Visit  Medication Sig Dispense Refill  . omeprazole (PRILOSEC) 20 MG capsule Take by mouth.    . traZODone (DESYREL) 50 MG tablet 1-3 tabs q hs     No current facility-administered medications on file prior to visit.     No past surgical history on file.  Allergies  Allergen Reactions  . Latex Dermatitis  . Strawberry Extract Hives    Social History   Socioeconomic History  . Marital status: Married    Spouse name: Not on file  . Number of children: Not on file  . Years of education: Not on file  . Highest education level: Not on file  Occupational History  . Not on file  Social Needs  . Financial resource strain: Not on file  . Food insecurity:    Worry: Not on file    Inability: Not on file  . Transportation needs:    Medical: Not on file    Non-medical: Not on file  Tobacco Use  . Smoking status: Never Smoker  . Smokeless tobacco: Never Used  Substance and Sexual Activity  . Alcohol  use: Not on file  . Drug use: Not on file  . Sexual activity: Not on file  Lifestyle  . Physical activity:    Days per week: Not on file    Minutes per session: Not on file  . Stress: Not on file  Relationships  . Social connections:    Talks on phone: Not on file    Gets together: Not on file    Attends religious service: Not on file    Active member of club or organization: Not on file    Attends meetings of clubs or organizations: Not on file    Relationship status: Not on file  . Intimate partner violence:    Fear of current or ex partner: Not on file    Emotionally abused: Not on file    Physically abused: Not on file    Forced sexual activity: Not on file  Other Topics Concern  . Not on file  Social History Narrative  . Not on file    No family history on file.  BP 140/88   Ht 5\' 8"  (1.727 m)   Wt 157 lb (71.2 kg)   BMI 23.87 kg/m   Review of Systems: See HPI above.     Objective:  Physical Exam:  Gen: NAD, comfortable in exam room  Right knee: Minimal effusion.  No bruising, other deformity. TTP medial joint line.  No other tenderness.  ROM 0 - 100 degrees, 5/5 strength. Positive ant drawer and lachmanns.  Negative post drawer. Negative valgus/varus testing.  Positive mcmurrays, apleys.  Negative patellar apprehension. NV intact distally.   Assessment & Plan:  1. Right knee injury - reinjury of initial injury back in January.  Suspect now she has both ACL tear with medial meniscus tear.  Will go ahead with MRI to further assess.  Knee brace with crutches.  Icing, diclofenac with percocet as needed.

## 2017-11-06 NOTE — Assessment & Plan Note (Signed)
reinjury of initial injury back in January.  Suspect now she has both ACL tear with medial meniscus tear.  Will go ahead with MRI to further assess.  Knee brace with crutches.  Icing, diclofenac with percocet as needed.

## 2017-11-06 NOTE — Telephone Encounter (Signed)
Patient was informed.

## 2017-11-08 ENCOUNTER — Ambulatory Visit
Admission: RE | Admit: 2017-11-08 | Discharge: 2017-11-08 | Disposition: A | Discharge status: Home / Self Care | Payer: BLUE CROSS/BLUE SHIELD | Source: Ambulatory Visit | Attending: Family Medicine | Admitting: Family Medicine

## 2017-11-08 DIAGNOSIS — S8991XA Unspecified injury of right lower leg, initial encounter: Secondary | ICD-10-CM

## 2017-11-08 DIAGNOSIS — M7121 Synovial cyst of popliteal space [Baker], right knee: Secondary | ICD-10-CM | POA: Diagnosis not present

## 2017-11-24 ENCOUNTER — Encounter: Payer: Self-pay | Admitting: Family Medicine

## 2017-11-24 ENCOUNTER — Ambulatory Visit (INDEPENDENT_AMBULATORY_CARE_PROVIDER_SITE_OTHER): Payer: BLUE CROSS/BLUE SHIELD | Admitting: Family Medicine

## 2017-11-24 VITALS — BP 129/81 | HR 62 | Ht 69.0 in | Wt 156.0 lb

## 2017-11-24 DIAGNOSIS — M79672 Pain in left foot: Secondary | ICD-10-CM | POA: Diagnosis not present

## 2017-11-24 DIAGNOSIS — S8991XD Unspecified injury of right lower leg, subsequent encounter: Secondary | ICD-10-CM | POA: Diagnosis not present

## 2017-11-24 DIAGNOSIS — M25561 Pain in right knee: Secondary | ICD-10-CM | POA: Diagnosis not present

## 2017-11-24 DIAGNOSIS — S82899A Other fracture of unspecified lower leg, initial encounter for closed fracture: Secondary | ICD-10-CM | POA: Diagnosis not present

## 2017-11-24 NOTE — Patient Instructions (Signed)
Wear knee brace when up and walking around. Icing 15 minutes at a time 3-4 times a day as needed. Diclofenac as needed. Start physical therapy and do home exercises on days you don't go to therapy. Follow up with me in 4 weeks for reevaluation. If you're not improving as expected we will consider orthopedic surgery referral.

## 2017-11-25 ENCOUNTER — Encounter: Payer: Self-pay | Admitting: Family Medicine

## 2017-11-25 NOTE — Progress Notes (Signed)
PCP: Madelaine Bhat., MD  Subjective:   HPI: Patient is a 32 y.o. female here for right knee, left foot pain.  1/3: Patient reports on 12/26 she was stepping down from a folding chair and jammed her right leg on the ground injuring her right ankle and knee Feels the ankle has improved - wearing ankle brace. Knee still hurts at 6-7/10 level, sharp with swelling. Difficulty sleeping, associated spasms. 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 Has been resting and icing. No skin changes, numbness.  6/26: Patient reports  Unfortunately she slipped and fell at home, twisted her right knee. She did not get imaging after last visit in January due to cost. She continued to struggle with problems with this knee acutely exacerbated with this recent fall. Difficulty bearing weight. Using crutches, icing, taking pain medication. No skin changes. Knee feels unstable.  7/15: Patient reports her right knee has improved since last visit. Pain level is 6/10 and sharp, feels very unstable without knee brace on. Has to rest often. Most pain around medial aspect of knee but also around patella. Doing home exercises, riding stationary bike. Feels like knee can lock on her. Her right foot gives her intermittent sharp pains laterally with full plantarflexion and curling of her toes. Can get a painful pop here. No skin changes, numbness.  History reviewed. No pertinent past medical history.  Current Outpatient Medications on File Prior to Visit  Medication Sig Dispense Refill  . omeprazole (PRILOSEC) 20 MG capsule Take by mouth.    . oxyCODONE-acetaminophen (PERCOCET/ROXICET) 5-325 MG tablet Take 1 tablet by mouth every 4 (four) hours as needed for severe pain. 30 tablet 0  . traZODone (DESYREL) 50 MG tablet 1-3 tabs q hs     No current facility-administered medications on file prior to visit.     History reviewed. No pertinent surgical  history.  Allergies  Allergen Reactions  . Latex Dermatitis  . Strawberry Extract Hives    Social History   Socioeconomic History  . Marital status: Married    Spouse name: Not on file  . Number of children: Not on file  . Years of education: Not on file  . Highest education level: Not on file  Occupational History  . Not on file  Social Needs  . Financial resource strain: Not on file  . Food insecurity:    Worry: Not on file    Inability: Not on file  . Transportation needs:    Medical: Not on file    Non-medical: Not on file  Tobacco Use  . Smoking status: Never Smoker  . Smokeless tobacco: Never Used  Substance and Sexual Activity  . Alcohol use: Not on file  . Drug use: Not on file  . Sexual activity: Not on file  Lifestyle  . Physical activity:    Days per week: Not on file    Minutes per session: Not on file  . Stress: Not on file  Relationships  . Social connections:    Talks on phone: Not on file    Gets together: Not on file    Attends religious service: Not on file    Active member of club or organization: Not on file    Attends meetings of clubs or organizations: Not on file    Relationship status: Not on file  . Intimate partner violence:    Fear of current or ex partner: Not on file    Emotionally abused: Not  on file    Physically abused: Not on file    Forced sexual activity: Not on file  Other Topics Concern  . Not on file  Social History Narrative  . Not on file    History reviewed. No pertinent family history.  BP 129/81   Pulse 62   Ht 5\' 9"  (1.753 m)   Wt 156 lb (70.8 kg)   BMI 23.04 kg/m   Review of Systems: See HPI above.     Objective:  Physical Exam:  Gen: NAD, comfortable in exam room  Right knee: Minimal effusion.  No bruising, other deformity. TTP medial joint line, less post patellar facets. ROM 0 - 100 degrees, 5/5 strength. Negative ant/post drawers. Pain with valgus but no laxity.  Negative varus.  Negative  lachmanns. Negative mcmurrays, apleys, patellar apprehension but pain with apprehension. NV intact distally.  Left foot: No deformity. FROM with 5/5 strength. No tenderness to palpation. NVI distally.   Assessment & Plan:  1. Right knee injury - Has had two injuries to this knee - MRI shows MCL sprain along with evidence of patellar dislocation.  She will continue with knee brace when up and walking around.  Icing, diclofenac as needed.  Start physical therapy and home exercises.  F/u in 4 weeks.  Consider orthopedic surgery referral if not improving.  2. Left foot pain - exam reassuring but having pain over cuboid still with full flexion of foot, digits.  Focus currently on her knee injury.  Consider arch supports.

## 2017-11-25 NOTE — Assessment & Plan Note (Signed)
Has had two injuries to this knee - MRI shows MCL sprain along with evidence of patellar dislocation.  She will continue with knee brace when up and walking around.  Icing, diclofenac as needed.  Start physical therapy and home exercises.  F/u in 4 weeks.  Consider orthopedic surgery referral if not improving.

## 2017-11-25 NOTE — Assessment & Plan Note (Signed)
exam reassuring but having pain over cuboid still with full flexion of foot, digits.  Focus currently on her knee injury.  Consider arch supports.

## 2017-12-05 ENCOUNTER — Ambulatory Visit: Payer: BLUE CROSS/BLUE SHIELD | Admitting: Physical Therapy

## 2017-12-05 DIAGNOSIS — R2689 Other abnormalities of gait and mobility: Secondary | ICD-10-CM | POA: Diagnosis not present

## 2017-12-05 DIAGNOSIS — M25561 Pain in right knee: Secondary | ICD-10-CM | POA: Diagnosis not present

## 2017-12-05 DIAGNOSIS — M6281 Muscle weakness (generalized): Secondary | ICD-10-CM | POA: Diagnosis not present

## 2017-12-12 DIAGNOSIS — R2689 Other abnormalities of gait and mobility: Secondary | ICD-10-CM | POA: Diagnosis not present

## 2017-12-12 DIAGNOSIS — M6281 Muscle weakness (generalized): Secondary | ICD-10-CM | POA: Diagnosis not present

## 2017-12-12 DIAGNOSIS — M25561 Pain in right knee: Secondary | ICD-10-CM | POA: Diagnosis not present

## 2017-12-16 DIAGNOSIS — R2689 Other abnormalities of gait and mobility: Secondary | ICD-10-CM | POA: Diagnosis not present

## 2017-12-16 DIAGNOSIS — M25561 Pain in right knee: Secondary | ICD-10-CM | POA: Diagnosis not present

## 2017-12-16 DIAGNOSIS — M6281 Muscle weakness (generalized): Secondary | ICD-10-CM | POA: Diagnosis not present

## 2017-12-19 DIAGNOSIS — R2689 Other abnormalities of gait and mobility: Secondary | ICD-10-CM | POA: Diagnosis not present

## 2017-12-19 DIAGNOSIS — M25561 Pain in right knee: Secondary | ICD-10-CM | POA: Diagnosis not present

## 2017-12-19 DIAGNOSIS — M6281 Muscle weakness (generalized): Secondary | ICD-10-CM | POA: Diagnosis not present

## 2017-12-22 ENCOUNTER — Ambulatory Visit: Payer: BLUE CROSS/BLUE SHIELD | Admitting: Family Medicine

## 2017-12-24 DIAGNOSIS — M6281 Muscle weakness (generalized): Secondary | ICD-10-CM | POA: Diagnosis not present

## 2017-12-24 DIAGNOSIS — M25561 Pain in right knee: Secondary | ICD-10-CM | POA: Diagnosis not present

## 2017-12-24 DIAGNOSIS — R2689 Other abnormalities of gait and mobility: Secondary | ICD-10-CM | POA: Diagnosis not present

## 2017-12-29 ENCOUNTER — Encounter: Payer: Self-pay | Admitting: Family Medicine

## 2017-12-29 ENCOUNTER — Ambulatory Visit (INDEPENDENT_AMBULATORY_CARE_PROVIDER_SITE_OTHER): Payer: BLUE CROSS/BLUE SHIELD | Admitting: Family Medicine

## 2017-12-29 VITALS — BP 107/67 | HR 108 | Ht 68.0 in | Wt 154.0 lb

## 2017-12-29 DIAGNOSIS — N3090 Cystitis, unspecified without hematuria: Secondary | ICD-10-CM | POA: Diagnosis not present

## 2017-12-29 DIAGNOSIS — R1084 Generalized abdominal pain: Secondary | ICD-10-CM | POA: Diagnosis not present

## 2017-12-29 DIAGNOSIS — R109 Unspecified abdominal pain: Secondary | ICD-10-CM | POA: Diagnosis not present

## 2017-12-29 DIAGNOSIS — Z888 Allergy status to other drugs, medicaments and biological substances status: Secondary | ICD-10-CM | POA: Diagnosis not present

## 2017-12-29 DIAGNOSIS — Z79899 Other long term (current) drug therapy: Secondary | ICD-10-CM | POA: Diagnosis not present

## 2017-12-29 DIAGNOSIS — M797 Fibromyalgia: Secondary | ICD-10-CM | POA: Diagnosis not present

## 2017-12-29 DIAGNOSIS — Z91040 Latex allergy status: Secondary | ICD-10-CM | POA: Diagnosis not present

## 2017-12-29 DIAGNOSIS — R110 Nausea: Secondary | ICD-10-CM | POA: Diagnosis not present

## 2017-12-29 DIAGNOSIS — S8991XD Unspecified injury of right lower leg, subsequent encounter: Secondary | ICD-10-CM | POA: Diagnosis not present

## 2017-12-29 NOTE — Progress Notes (Signed)
PCP: Madelaine Bhat., MD  Subjective:   HPI: Patient is a 32 y.o. female here for right knee.  1/3: Patient reports on 12/26 she was stepping down from a folding chair and jammed her right leg on the ground injuring her right ankle and knee Feels the ankle has improved - wearing ankle brace. Knee still hurts at 6-7/10 level, sharp with swelling. Difficulty sleeping, associated spasms. 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 Has been resting and icing. No skin changes, numbness.  6/26: Patient reports  Unfortunately she slipped and fell at home, twisted her right knee. She did not get imaging after last visit in January due to cost. She continued to struggle with problems with this knee acutely exacerbated with this recent fall. Difficulty bearing weight. Using crutches, icing, taking pain medication. No skin changes. Knee feels unstable.  7/15: Patient reports her right knee has improved since last visit. Pain level is 6/10 and sharp, feels very unstable without knee brace on. Has to rest often. Most pain around medial aspect of knee but also around patella. Doing home exercises, riding stationary bike. Feels like knee can lock on her. Her right foot gives her intermittent sharp pains laterally with full plantarflexion and curling of her toes. Can get a painful pop here. No skin changes, numbness.  8/19: Patient reports she feels about the same despite regular physical therapy, home exercises, bracing, tylenol. Pain level 7/10 and sharp, feels unstable still like kneecap is going to pop out. No skin changes.  History reviewed. No pertinent past medical history.  Current Outpatient Medications on File Prior to Visit  Medication Sig Dispense Refill  . omeprazole (PRILOSEC) 20 MG capsule Take by mouth.    . traZODone (DESYREL) 50 MG tablet 1-3 tabs q hs     No current facility-administered medications on file prior to  visit.     History reviewed. No pertinent surgical history.  Allergies  Allergen Reactions  . Latex Dermatitis  . Strawberry Extract Hives    Social History   Socioeconomic History  . Marital status: Married    Spouse name: Not on file  . Number of children: Not on file  . Years of education: Not on file  . Highest education level: Not on file  Occupational History  . Not on file  Social Needs  . Financial resource strain: Not on file  . Food insecurity:    Worry: Not on file    Inability: Not on file  . Transportation needs:    Medical: Not on file    Non-medical: Not on file  Tobacco Use  . Smoking status: Never Smoker  . Smokeless tobacco: Never Used  Substance and Sexual Activity  . Alcohol use: Not on file  . Drug use: Not on file  . Sexual activity: Not on file  Lifestyle  . Physical activity:    Days per week: Not on file    Minutes per session: Not on file  . Stress: Not on file  Relationships  . Social connections:    Talks on phone: Not on file    Gets together: Not on file    Attends religious service: Not on file    Active member of club or organization: Not on file    Attends meetings of clubs or organizations: Not on file    Relationship status: Not on file  . Intimate partner violence:    Fear of current or ex partner: Not  on file    Emotionally abused: Not on file    Physically abused: Not on file    Forced sexual activity: Not on file  Other Topics Concern  . Not on file  Social History Narrative  . Not on file    History reviewed. No pertinent family history.  BP 107/67   Pulse (!) 108   Ht 5\' 8"  (1.727 m)   Wt 154 lb (69.9 kg)   BMI 23.42 kg/m   Review of Systems: See HPI above.     Objective:  Physical Exam:  Gen: NAD, comfortable in exam room  Right knee: No gross deformity, ecchymoses, swelling. TTP medial post patellar facet.  No joint line tenderness. ROM 0 - 120 degrees, 5/5 strength. Negative ant/post drawers.  Negative valgus/varus testing. Negative lachmanns. Negative mcmurrays, apleys.  Positive patellar apprehension. NV intact distally.   Assessment & Plan:  1. Right knee injury - MCL has healed clinically.  Still struggling with patellar instability despite rehab, time, bracing and not making progress.  Will go ahead with orthopedic referral to discuss MPFL reconstruction.

## 2017-12-29 NOTE — Addendum Note (Signed)
Addended by: Sherrie George F on: 12/29/2017 04:20 PM   Modules accepted: Orders

## 2017-12-29 NOTE — Patient Instructions (Signed)
We will go ahead with an orthopedic referral given you're not improving as expected. Continue the brace, icing, elevation in the meantime. Continue physical therapy and your home exercises.

## 2017-12-31 DIAGNOSIS — R2689 Other abnormalities of gait and mobility: Secondary | ICD-10-CM | POA: Diagnosis not present

## 2017-12-31 DIAGNOSIS — M6281 Muscle weakness (generalized): Secondary | ICD-10-CM | POA: Diagnosis not present

## 2017-12-31 DIAGNOSIS — M25561 Pain in right knee: Secondary | ICD-10-CM | POA: Diagnosis not present

## 2018-01-02 DIAGNOSIS — M6281 Muscle weakness (generalized): Secondary | ICD-10-CM | POA: Diagnosis not present

## 2018-01-02 DIAGNOSIS — M25561 Pain in right knee: Secondary | ICD-10-CM | POA: Diagnosis not present

## 2018-01-02 DIAGNOSIS — R2689 Other abnormalities of gait and mobility: Secondary | ICD-10-CM | POA: Diagnosis not present

## 2018-01-05 DIAGNOSIS — M25561 Pain in right knee: Secondary | ICD-10-CM | POA: Diagnosis not present

## 2018-01-07 DIAGNOSIS — M25561 Pain in right knee: Secondary | ICD-10-CM | POA: Diagnosis not present

## 2018-01-07 DIAGNOSIS — R2689 Other abnormalities of gait and mobility: Secondary | ICD-10-CM | POA: Diagnosis not present

## 2018-01-07 DIAGNOSIS — M6281 Muscle weakness (generalized): Secondary | ICD-10-CM | POA: Diagnosis not present

## 2018-01-14 DIAGNOSIS — M25561 Pain in right knee: Secondary | ICD-10-CM | POA: Diagnosis not present

## 2018-01-14 DIAGNOSIS — M6281 Muscle weakness (generalized): Secondary | ICD-10-CM | POA: Diagnosis not present

## 2018-01-14 DIAGNOSIS — R2689 Other abnormalities of gait and mobility: Secondary | ICD-10-CM | POA: Diagnosis not present

## 2018-01-21 ENCOUNTER — Encounter: Payer: Self-pay | Admitting: Cardiology

## 2018-01-21 ENCOUNTER — Ambulatory Visit (INDEPENDENT_AMBULATORY_CARE_PROVIDER_SITE_OTHER): Payer: BLUE CROSS/BLUE SHIELD | Admitting: Cardiology

## 2018-01-21 VITALS — BP 110/60 | HR 70 | Ht 68.0 in | Wt 154.0 lb

## 2018-01-21 DIAGNOSIS — Z01810 Encounter for preprocedural cardiovascular examination: Secondary | ICD-10-CM

## 2018-01-21 DIAGNOSIS — I471 Supraventricular tachycardia: Secondary | ICD-10-CM | POA: Diagnosis not present

## 2018-01-21 NOTE — Patient Instructions (Signed)
Medication Instructions:  Your physician recommends that you continue on your current medications as directed. Please refer to the Current Medication list given to you today.  Labwork: None  Testing/Procedures: Your physician has requested that you have an echocardiogram. Echocardiography is a painless test that uses sound waves to create images of your heart. It provides your doctor with information about the size and shape of your heart and how well your heart's chambers and valves are working. This procedure takes approximately one hour. There are no restrictions for this procedure.  Follow-Up: Your physician recommends that you schedule a follow-up appointment in: 6 months  Any Other Special Instructions Will Be Listed Below (If Applicable).     If you need a refill on your cardiac medications before your next appointment, please call your pharmacy.   [facility]  Caci Orren A, RN, BSN  

## 2018-01-21 NOTE — Progress Notes (Signed)
Cardiology Office Note:    Date:  01/21/2018   ID:  Erica Welch, DOB 1985-05-16, MRN [id number]  PCP:  Madelaine Bhat., MD  Cardiologist:  Jenean Lindau, MD   Referring MD: Madelaine Bhat., MD    ASSESSMENT:    1. Preop cardiovascular exam   2. AVNRT (AV nodal re-entry tachycardia) ([place])    PLAN:    In order of problems listed above:  1. Primary prevention stressed with the patient.  Importance of compliance with medications when needed was stressed. 2. She has had paroxysms of AVNRT in the past and this is by her history.  These are sparse and far and few in between.  She is a very active lady.  We will do an echocardiogram to assess murmur heard on auscultation.  In view of her overall good health, good effort tolerance and paucity of symptoms I think she is not at high risk for coronary events during the aforementioned surgery.  Meticulous hemodynamic monitoring will further reduce the risk of coronary events.  Should she have any issues with AVNRT she should be treated with guideline directed medical therapy. 3. She will be seen in follow-up appointment in 6 months or earlier if she has any concerns.   Medication Adjustments/Labs and Tests Ordered: Current medicines are reviewed at length with the patient today.  Concerns regarding medicines are outlined above.  No orders of the defined types were placed in this encounter.  No orders of the defined types were placed in this encounter.    History of Present Illness:    Erica Welch is a 32 y.o. female who is being seen today for the evaluation of preop cardiovascular evaluation at the request of Madelaine Bhat., MD.  Patient is a pleasant 32 year old female.  She has past medical history of AVNRT.  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  Echocardiogram at that time was unremarkable.  She has had meniscal tears of her  right knee and planning to undergo arthroscopic surgery.  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  She says even going upstairs and downstairs does not give her any symptoms and she rarely has any palpitations if any.  She uses atenolol when it happens with complete relief.  She is very intelligent lady and gives a good history.  No syncope or dizziness.  At the time of my evaluation, the patient is alert awake oriented and in no distress.  Past Medical History:  Diagnosis Date  . AV nodal tachycardia ([place]) 2017  . Cubital tunnel syndrome 2017  . Dyspareunia due to medical condition in female   . Endometriosis   . GERD (gastroesophageal reflux disease)   . Hyperlipidemia   . IBS (irritable bowel syndrome)   . Insomnia   . Kidney stone   . Memory loss   . Migraine   . Occipital neuralgia 2017  . Patellofemoral arthralgia of both knees   . Right flank pain 2017  . Rotator cuff tendinitis 2017  . Vitamin B12 deficiency   . Vitamin D deficiency     Past Surgical History:  Procedure Laterality Date  . ABDOMINAL SURGERY  2007,2010,2012,2014  . CESAREAN SECTION  03/19/2006  . CESAREAN SECTION  12/09/2008  . NASAL SEPTUM SURGERY  2013  . PELVIC LAPAROSCOPY  2012  . TUBAL LIGATION  N/A   . TYMPANOPLASTY    . WISDOM TOOTH EXTRACTION      Current Medications: Current Meds  Medication Sig  . cyanocobalamin 1000 MCG tablet Take 1,000 mcg by mouth daily.  . Multiple Vitamins-Minerals (MULTIVITAMIN WITH MINERALS) tablet Take 1 tablet by mouth daily.  Marland Kitchen omeprazole (PRILOSEC) 20 MG capsule Take 20 mg by mouth as needed.   . Vitamin D, Ergocalciferol, (DRISDOL) 50000 units CAPS capsule Take 50,000 Units by mouth every 7 (seven) days.     Allergies:   Latex and Strawberry extract   Social History   Socioeconomic History  . Marital status: Married    Spouse name: Not on file  . Number of children: Not on file  . Years of education: Not on file   . Highest education level: Not on file  Occupational History  . Not on file  Social Needs  . Financial resource strain: Not on file  . Food insecurity:    Worry: Not on file    Inability: Not on file  . Transportation needs:    Medical: Not on file    Non-medical: Not on file  Tobacco Use  . Smoking status: Never Smoker  . Smokeless tobacco: Never Used  Substance and Sexual Activity  . Alcohol use: Not on file  . Drug use: Not on file  . Sexual activity: Not on file  Lifestyle  . Physical activity:    Days per week: Not on file    Minutes per session: Not on file  . Stress: Not on file  Relationships  . Social connections:    Talks on phone: Not on file    Gets together: Not on file    Attends religious service: Not on file    Active member of club or organization: Not on file    Attends meetings of clubs or organizations: Not on file    Relationship status: Not on file  Other Topics Concern  . Not on file  Social History Narrative  . Not on file     Family History: The patient's family history is not on file.  ROS:   Please see the history of present illness.    All other systems reviewed and are negative.  EKGs/Labs/Other Studies Reviewed:    The following studies were reviewed today: I discussed my findings with the patient at extensive length.  EKG reveals sinus rhythm and nonspecific ST-T changes.   Recent Labs: No results found for requested labs within last 8760 hours.  Recent Lipid Panel No results found for: CHOL, TRIG, HDL, CHOLHDL, VLDL, LDLCALC, LDLDIRECT  Physical Exam:    VS:  BP 110/60 (BP Location: Right Arm, Patient Position: Sitting, Cuff Size: Normal)   Pulse 70   Ht 5\' 8"  (1.727 m)   Wt 154 lb (69.9 kg)   SpO2 98%   BMI 23.42 kg/m     Wt Readings from Last 3 Encounters:  01/21/18 154 lb (69.9 kg)  12/29/17 154 lb (69.9 kg)  11/24/17 156 lb (70.8 kg)     GEN: Patient is in no acute distress HEENT: Normal NECK: No JVD; No  carotid bruits LYMPHATICS: No lymphadenopathy CARDIAC: S1 S2 regular, 2/6 systolic murmur at the apex. RESPIRATORY:  Clear to auscultation without rales, wheezing or rhonchi  ABDOMEN: Soft, non-tender, non-distended MUSCULOSKELETAL:  No edema; No deformity  SKIN: Warm and dry NEUROLOGIC:  Alert and oriented x 3 PSYCHIATRIC:  Normal affect    Signed, Jenean Lindau, MD  01/21/2018  9:[place]

## 2018-01-28 ENCOUNTER — Ambulatory Visit (HOSPITAL_BASED_OUTPATIENT_CLINIC_OR_DEPARTMENT_OTHER): Payer: BLUE CROSS/BLUE SHIELD

## 2018-01-30 ENCOUNTER — Ambulatory Visit (HOSPITAL_BASED_OUTPATIENT_CLINIC_OR_DEPARTMENT_OTHER)
Admission: RE | Admit: 2018-01-30 | Discharge: 2018-01-30 | Disposition: A | Discharge status: Home / Self Care | Payer: BLUE CROSS/BLUE SHIELD | Source: Ambulatory Visit | Attending: Cardiology | Admitting: Cardiology

## 2018-01-30 DIAGNOSIS — I471 Supraventricular tachycardia: Secondary | ICD-10-CM

## 2018-01-30 DIAGNOSIS — Z01810 Encounter for preprocedural cardiovascular examination: Secondary | ICD-10-CM

## 2018-01-30 DIAGNOSIS — I340 Nonrheumatic mitral (valve) insufficiency: Secondary | ICD-10-CM | POA: Insufficient documentation

## 2018-01-30 NOTE — Progress Notes (Signed)
  Echocardiogram 2D Echocardiogram has been performed.  Aditya Nastasi T Donnisha Besecker 01/30/2018, 4:01 PM

## 2018-02-02 ENCOUNTER — Telehealth: Payer: Self-pay

## 2018-02-10 ENCOUNTER — Encounter: Payer: Self-pay | Admitting: Family Medicine

## 2018-02-12 NOTE — Telephone Encounter (Signed)
none

## 2018-02-16 NOTE — Telephone Encounter (Signed)
Plan to go ahead with MRI of ankle which would include this area of the foot.

## 2018-02-17 ENCOUNTER — Encounter: Payer: Self-pay | Admitting: *Deleted

## 2018-02-17 NOTE — Addendum Note (Signed)
Addended by: Sherrie George F on: 02/17/2018 10:26 AM   Modules accepted: Orders

## 2018-02-20 ENCOUNTER — Ambulatory Visit (HOSPITAL_BASED_OUTPATIENT_CLINIC_OR_DEPARTMENT_OTHER)
Admission: RE | Admit: 2018-02-20 | Discharge: 2018-02-20 | Disposition: A | Discharge status: Home / Self Care | Payer: BLUE CROSS/BLUE SHIELD | Source: Ambulatory Visit | Attending: Family Medicine | Admitting: Family Medicine

## 2018-02-20 DIAGNOSIS — M79672 Pain in left foot: Secondary | ICD-10-CM | POA: Insufficient documentation

## 2018-02-24 ENCOUNTER — Encounter: Payer: Self-pay | Admitting: *Deleted

## 2018-02-24 NOTE — Addendum Note (Signed)
Addended by: Sherrie George F on: 02/24/2018 02:08 PM   Modules accepted: Orders

## 2018-02-25 DIAGNOSIS — M6751 Plica syndrome, right knee: Secondary | ICD-10-CM | POA: Diagnosis not present

## 2018-02-25 DIAGNOSIS — G8918 Other acute postprocedural pain: Secondary | ICD-10-CM | POA: Diagnosis not present

## 2018-02-25 DIAGNOSIS — M94261 Chondromalacia, right knee: Secondary | ICD-10-CM | POA: Diagnosis not present

## 2018-02-28 ENCOUNTER — Ambulatory Visit
Admission: RE | Admit: 2018-02-28 | Discharge: 2018-02-28 | Disposition: A | Discharge status: Home / Self Care | Payer: BLUE CROSS/BLUE SHIELD | Source: Ambulatory Visit | Attending: Family Medicine | Admitting: Family Medicine

## 2018-02-28 DIAGNOSIS — R600 Localized edema: Secondary | ICD-10-CM | POA: Diagnosis not present

## 2018-02-28 DIAGNOSIS — S82899A Other fracture of unspecified lower leg, initial encounter for closed fracture: Secondary | ICD-10-CM

## 2018-02-28 DIAGNOSIS — M25562 Pain in left knee: Secondary | ICD-10-CM | POA: Diagnosis not present

## 2018-03-01 ENCOUNTER — Encounter: Payer: Self-pay | Admitting: Family Medicine

## 2018-03-02 DIAGNOSIS — Z1151 Encounter for screening for human papillomavirus (HPV): Secondary | ICD-10-CM | POA: Diagnosis not present

## 2018-03-02 DIAGNOSIS — N979 Female infertility, unspecified: Secondary | ICD-10-CM | POA: Diagnosis not present

## 2018-03-02 DIAGNOSIS — N926 Irregular menstruation, unspecified: Secondary | ICD-10-CM | POA: Diagnosis not present

## 2018-03-02 NOTE — Telephone Encounter (Signed)
Spoke with patient - see addendum to her MRI report.

## 2018-03-04 DIAGNOSIS — B9789 Other viral agents as the cause of diseases classified elsewhere: Secondary | ICD-10-CM | POA: Diagnosis not present

## 2018-03-04 DIAGNOSIS — J069 Acute upper respiratory infection, unspecified: Secondary | ICD-10-CM | POA: Diagnosis not present

## 2018-03-04 DIAGNOSIS — R0789 Other chest pain: Secondary | ICD-10-CM | POA: Diagnosis not present

## 2018-03-05 DIAGNOSIS — M25561 Pain in right knee: Secondary | ICD-10-CM | POA: Diagnosis not present

## 2018-03-05 DIAGNOSIS — M6281 Muscle weakness (generalized): Secondary | ICD-10-CM | POA: Diagnosis not present

## 2018-03-05 DIAGNOSIS — M25562 Pain in left knee: Secondary | ICD-10-CM | POA: Diagnosis not present

## 2018-03-05 DIAGNOSIS — R2689 Other abnormalities of gait and mobility: Secondary | ICD-10-CM | POA: Diagnosis not present

## 2018-03-10 DIAGNOSIS — N979 Female infertility, unspecified: Secondary | ICD-10-CM | POA: Diagnosis not present

## 2018-03-16 ENCOUNTER — Encounter: Payer: Self-pay | Admitting: Family Medicine

## 2018-09-01 DIAGNOSIS — Z98891 History of uterine scar from previous surgery: Secondary | ICD-10-CM | POA: Insufficient documentation

## 2018-09-24 DIAGNOSIS — O09291 Supervision of pregnancy with other poor reproductive or obstetric history, first trimester: Secondary | ICD-10-CM | POA: Insufficient documentation

## 2018-10-08 DIAGNOSIS — O039 Complete or unspecified spontaneous abortion without complication: Secondary | ICD-10-CM | POA: Diagnosis not present

## 2018-11-18 DIAGNOSIS — Z8759 Personal history of other complications of pregnancy, childbirth and the puerperium: Secondary | ICD-10-CM | POA: Diagnosis not present

## 2018-11-18 DIAGNOSIS — Z9889 Other specified postprocedural states: Secondary | ICD-10-CM | POA: Diagnosis not present

## 2018-12-02 DIAGNOSIS — Z91040 Latex allergy status: Secondary | ICD-10-CM | POA: Diagnosis not present

## 2018-12-02 DIAGNOSIS — N189 Chronic kidney disease, unspecified: Secondary | ICD-10-CM | POA: Diagnosis not present

## 2018-12-02 DIAGNOSIS — E785 Hyperlipidemia, unspecified: Secondary | ICD-10-CM | POA: Diagnosis not present

## 2018-12-02 DIAGNOSIS — Z79899 Other long term (current) drug therapy: Secondary | ICD-10-CM | POA: Diagnosis not present

## 2018-12-02 DIAGNOSIS — Z91048 Other nonmedicinal substance allergy status: Secondary | ICD-10-CM | POA: Diagnosis not present

## 2018-12-02 DIAGNOSIS — Z87891 Personal history of nicotine dependence: Secondary | ICD-10-CM | POA: Diagnosis not present

## 2018-12-02 DIAGNOSIS — R197 Diarrhea, unspecified: Secondary | ICD-10-CM | POA: Diagnosis not present

## 2018-12-02 DIAGNOSIS — R112 Nausea with vomiting, unspecified: Secondary | ICD-10-CM | POA: Diagnosis not present

## 2018-12-02 DIAGNOSIS — M797 Fibromyalgia: Secondary | ICD-10-CM | POA: Diagnosis not present

## 2018-12-08 DIAGNOSIS — R109 Unspecified abdominal pain: Secondary | ICD-10-CM | POA: Diagnosis not present

## 2018-12-08 DIAGNOSIS — R110 Nausea: Secondary | ICD-10-CM | POA: Diagnosis not present

## 2018-12-18 DIAGNOSIS — Z3201 Encounter for pregnancy test, result positive: Secondary | ICD-10-CM | POA: Diagnosis not present

## 2018-12-23 DIAGNOSIS — Z3201 Encounter for pregnancy test, result positive: Secondary | ICD-10-CM | POA: Diagnosis not present

## 2018-12-24 DIAGNOSIS — Z20828 Contact with and (suspected) exposure to other viral communicable diseases: Secondary | ICD-10-CM | POA: Diagnosis not present

## 2018-12-24 DIAGNOSIS — Z03818 Encounter for observation for suspected exposure to other biological agents ruled out: Secondary | ICD-10-CM | POA: Diagnosis not present

## 2018-12-28 DIAGNOSIS — Z9851 Tubal ligation status: Secondary | ICD-10-CM | POA: Diagnosis not present

## 2018-12-31 DIAGNOSIS — Z9889 Other specified postprocedural states: Secondary | ICD-10-CM | POA: Diagnosis not present

## 2018-12-31 DIAGNOSIS — O09291 Supervision of pregnancy with other poor reproductive or obstetric history, first trimester: Secondary | ICD-10-CM | POA: Diagnosis not present

## 2018-12-31 DIAGNOSIS — O3680X0 Pregnancy with inconclusive fetal viability, not applicable or unspecified: Secondary | ICD-10-CM | POA: Diagnosis not present

## 2019-01-05 DIAGNOSIS — O09291 Supervision of pregnancy with other poor reproductive or obstetric history, first trimester: Secondary | ICD-10-CM | POA: Diagnosis not present

## 2019-01-05 DIAGNOSIS — Z9889 Other specified postprocedural states: Secondary | ICD-10-CM | POA: Diagnosis not present

## 2019-01-05 DIAGNOSIS — Z9851 Tubal ligation status: Secondary | ICD-10-CM | POA: Diagnosis not present

## 2019-01-15 DIAGNOSIS — O0990 Supervision of high risk pregnancy, unspecified, unspecified trimester: Secondary | ICD-10-CM | POA: Diagnosis not present

## 2019-01-15 DIAGNOSIS — O3680X0 Pregnancy with inconclusive fetal viability, not applicable or unspecified: Secondary | ICD-10-CM | POA: Diagnosis not present

## 2019-01-15 DIAGNOSIS — F419 Anxiety disorder, unspecified: Secondary | ICD-10-CM | POA: Diagnosis not present

## 2019-01-15 DIAGNOSIS — Z98891 History of uterine scar from previous surgery: Secondary | ICD-10-CM | POA: Diagnosis not present

## 2019-01-15 DIAGNOSIS — F329 Major depressive disorder, single episode, unspecified: Secondary | ICD-10-CM | POA: Diagnosis not present

## 2019-01-15 DIAGNOSIS — Z9889 Other specified postprocedural states: Secondary | ICD-10-CM | POA: Diagnosis not present

## 2019-01-15 DIAGNOSIS — Z8759 Personal history of other complications of pregnancy, childbirth and the puerperium: Secondary | ICD-10-CM | POA: Diagnosis not present

## 2019-01-15 DIAGNOSIS — Z3480 Encounter for supervision of other normal pregnancy, unspecified trimester: Secondary | ICD-10-CM | POA: Diagnosis not present

## 2019-01-15 DIAGNOSIS — R7989 Other specified abnormal findings of blood chemistry: Secondary | ICD-10-CM | POA: Diagnosis not present

## 2019-01-19 IMAGING — DX DG FOOT COMPLETE 3+V*L*
3 series · 3 of 3 positions shown · non-contrast
Comparison: None

CLINICAL DATA: Cuboid fracture June 2017, lateral foot pain off
and on since

EXAM:
LEFT FOOT - COMPLETE 3+ VIEW

[foot ap]
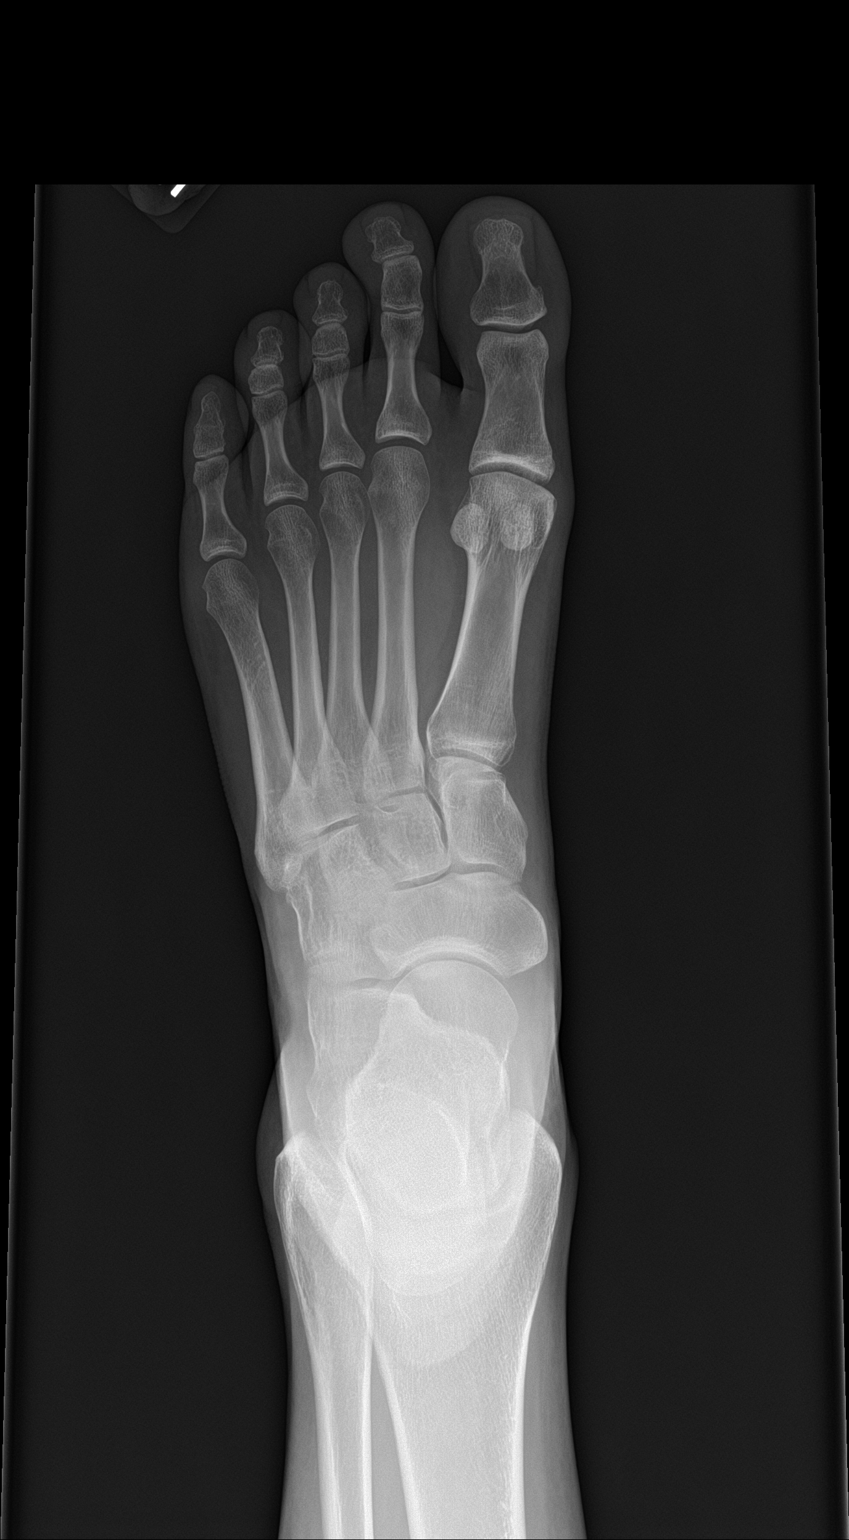

[foot obl]
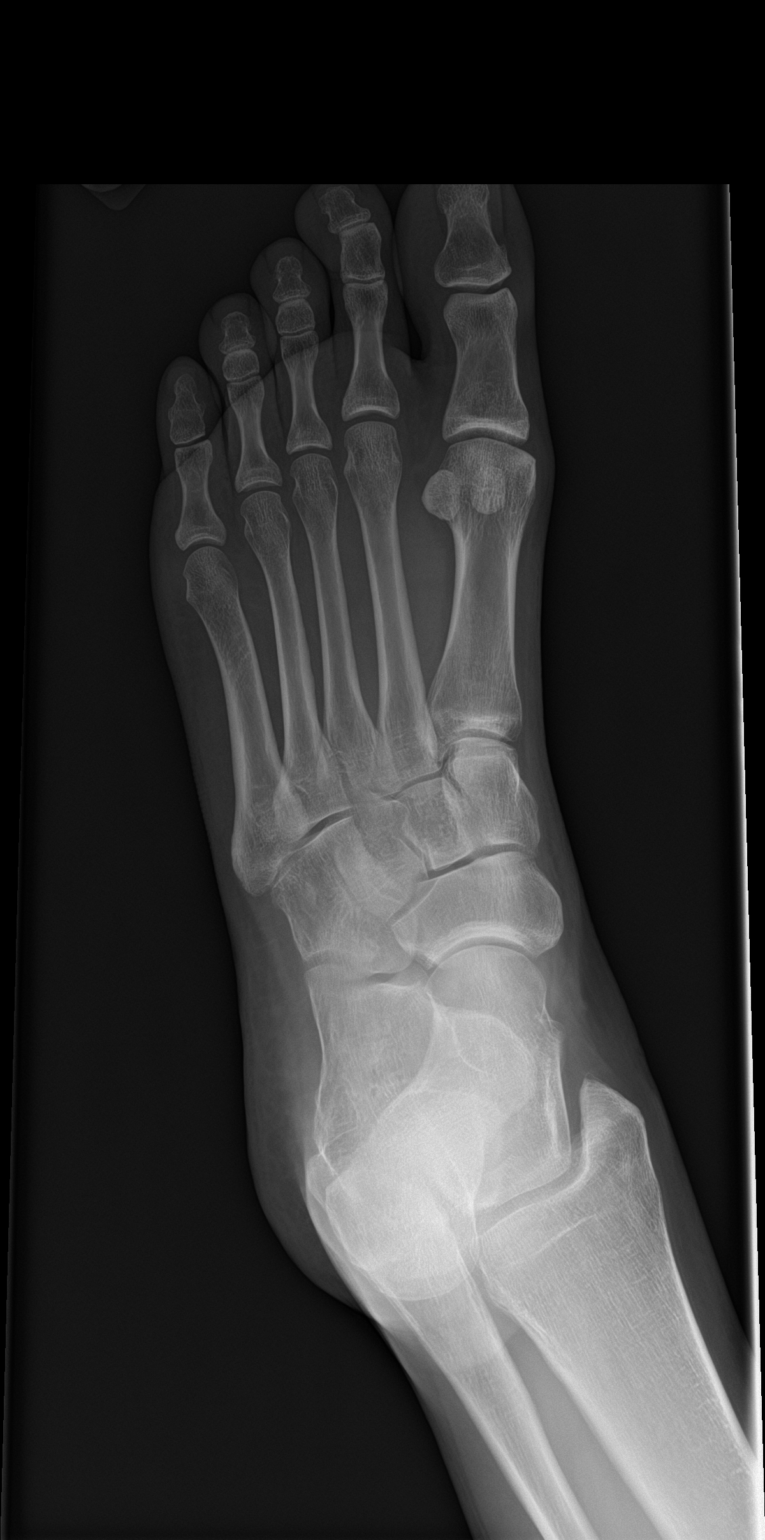

[foot lat]
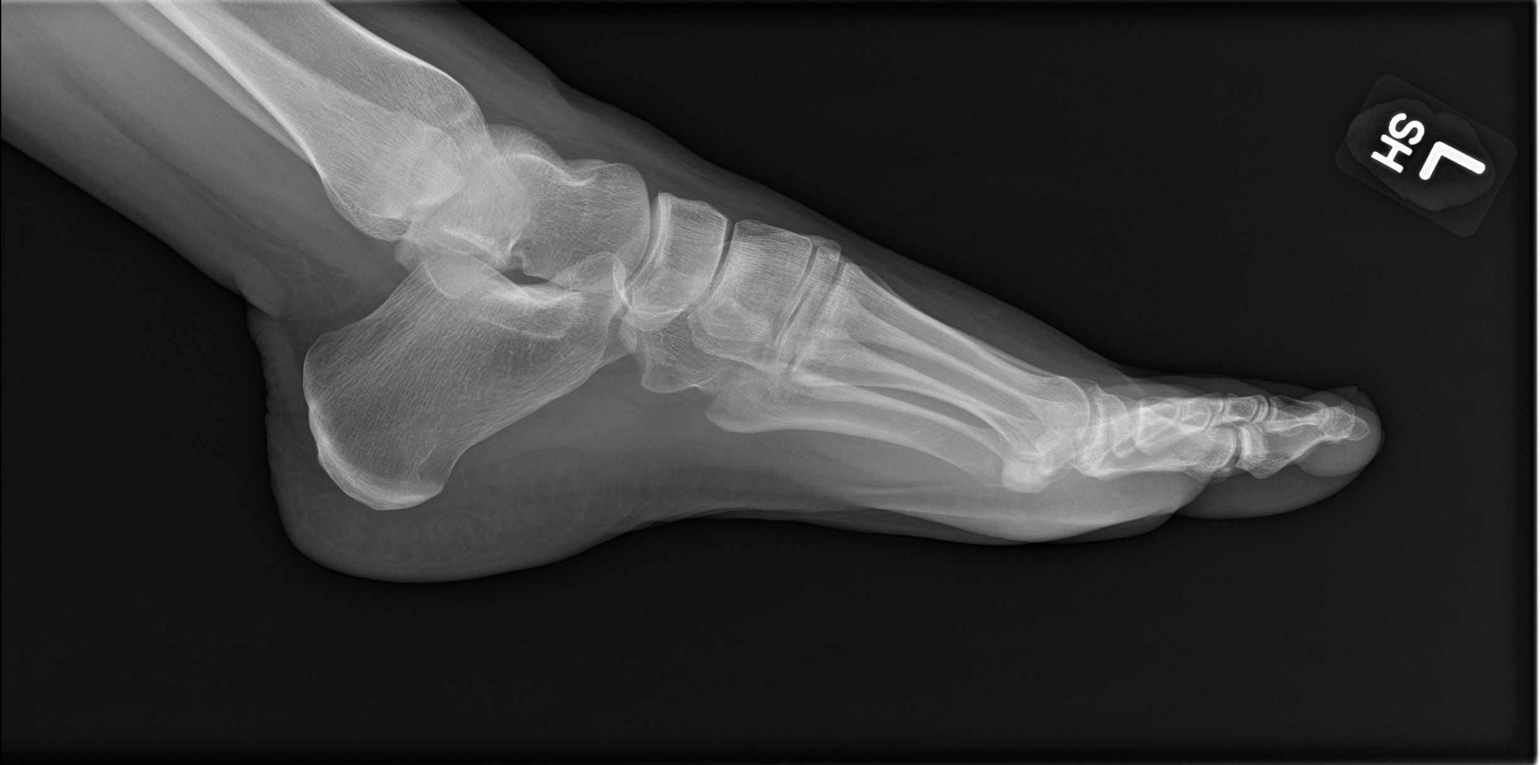

[3 of 3 positions shown; findings below may reference images not displayed]

FINDINGS: Osseous mineralization normal.

Joint spaces preserved.

No acute fracture, dislocation, or bone destruction.

Specifically, no definite cuboid abnormalities are identified.
IMPRESSION: No acute abnormality.

## 2019-01-26 DIAGNOSIS — O039 Complete or unspecified spontaneous abortion without complication: Secondary | ICD-10-CM | POA: Diagnosis not present

## 2019-01-26 DIAGNOSIS — O0289 Other abnormal products of conception: Secondary | ICD-10-CM | POA: Diagnosis not present

## 2019-01-26 DIAGNOSIS — O3680X0 Pregnancy with inconclusive fetal viability, not applicable or unspecified: Secondary | ICD-10-CM | POA: Diagnosis not present

## 2019-01-28 DIAGNOSIS — K589 Irritable bowel syndrome without diarrhea: Secondary | ICD-10-CM | POA: Diagnosis not present

## 2019-01-28 DIAGNOSIS — O99281 Endocrine, nutritional and metabolic diseases complicating pregnancy, first trimester: Secondary | ICD-10-CM | POA: Diagnosis not present

## 2019-01-28 DIAGNOSIS — F329 Major depressive disorder, single episode, unspecified: Secondary | ICD-10-CM | POA: Diagnosis not present

## 2019-01-28 DIAGNOSIS — K219 Gastro-esophageal reflux disease without esophagitis: Secondary | ICD-10-CM | POA: Diagnosis not present

## 2019-01-28 DIAGNOSIS — G43909 Migraine, unspecified, not intractable, without status migrainosus: Secondary | ICD-10-CM | POA: Diagnosis not present

## 2019-01-28 DIAGNOSIS — O99351 Diseases of the nervous system complicating pregnancy, first trimester: Secondary | ICD-10-CM | POA: Diagnosis not present

## 2019-01-28 DIAGNOSIS — I471 Supraventricular tachycardia: Secondary | ICD-10-CM | POA: Diagnosis not present

## 2019-01-28 DIAGNOSIS — Z79899 Other long term (current) drug therapy: Secondary | ICD-10-CM | POA: Diagnosis not present

## 2019-01-28 DIAGNOSIS — M797 Fibromyalgia: Secondary | ICD-10-CM | POA: Diagnosis not present

## 2019-01-28 DIAGNOSIS — J45909 Unspecified asthma, uncomplicated: Secondary | ICD-10-CM | POA: Diagnosis not present

## 2019-01-28 DIAGNOSIS — O021 Missed abortion: Secondary | ICD-10-CM | POA: Diagnosis not present

## 2019-01-28 DIAGNOSIS — F419 Anxiety disorder, unspecified: Secondary | ICD-10-CM | POA: Diagnosis not present

## 2019-01-28 DIAGNOSIS — O99611 Diseases of the digestive system complicating pregnancy, first trimester: Secondary | ICD-10-CM | POA: Diagnosis not present

## 2019-01-28 DIAGNOSIS — E785 Hyperlipidemia, unspecified: Secondary | ICD-10-CM | POA: Diagnosis not present

## 2019-01-28 DIAGNOSIS — O99341 Other mental disorders complicating pregnancy, first trimester: Secondary | ICD-10-CM | POA: Diagnosis not present

## 2019-01-28 DIAGNOSIS — Z91048 Other nonmedicinal substance allergy status: Secondary | ICD-10-CM | POA: Diagnosis not present

## 2019-01-28 DIAGNOSIS — Z791 Long term (current) use of non-steroidal anti-inflammatories (NSAID): Secondary | ICD-10-CM | POA: Diagnosis not present

## 2019-01-28 DIAGNOSIS — M1990 Unspecified osteoarthritis, unspecified site: Secondary | ICD-10-CM | POA: Diagnosis not present

## 2019-01-28 DIAGNOSIS — Z87891 Personal history of nicotine dependence: Secondary | ICD-10-CM | POA: Diagnosis not present

## 2019-01-28 DIAGNOSIS — O99511 Diseases of the respiratory system complicating pregnancy, first trimester: Secondary | ICD-10-CM | POA: Diagnosis not present

## 2019-01-28 DIAGNOSIS — Z91040 Latex allergy status: Secondary | ICD-10-CM | POA: Diagnosis not present

## 2019-01-28 DIAGNOSIS — M5030 Other cervical disc degeneration, unspecified cervical region: Secondary | ICD-10-CM | POA: Diagnosis not present

## 2019-01-28 DIAGNOSIS — Z800 Family history of malignant neoplasm of digestive organs: Secondary | ICD-10-CM | POA: Diagnosis not present

## 2019-01-28 DIAGNOSIS — O99411 Diseases of the circulatory system complicating pregnancy, first trimester: Secondary | ICD-10-CM | POA: Diagnosis not present

## 2019-02-09 DIAGNOSIS — O039 Complete or unspecified spontaneous abortion without complication: Secondary | ICD-10-CM | POA: Diagnosis not present

## 2019-02-17 DIAGNOSIS — O039 Complete or unspecified spontaneous abortion without complication: Secondary | ICD-10-CM | POA: Diagnosis not present

## 2019-02-24 DIAGNOSIS — O039 Complete or unspecified spontaneous abortion without complication: Secondary | ICD-10-CM | POA: Diagnosis not present

## 2019-02-25 DIAGNOSIS — Z20828 Contact with and (suspected) exposure to other viral communicable diseases: Secondary | ICD-10-CM | POA: Diagnosis not present

## 2019-02-25 DIAGNOSIS — O019 Hydatidiform mole, unspecified: Secondary | ICD-10-CM | POA: Diagnosis not present

## 2019-03-01 DIAGNOSIS — O019 Hydatidiform mole, unspecified: Secondary | ICD-10-CM | POA: Diagnosis not present

## 2019-03-01 DIAGNOSIS — Z87891 Personal history of nicotine dependence: Secondary | ICD-10-CM | POA: Diagnosis not present

## 2019-03-01 DIAGNOSIS — Z79899 Other long term (current) drug therapy: Secondary | ICD-10-CM | POA: Diagnosis not present

## 2019-03-03 DIAGNOSIS — O019 Hydatidiform mole, unspecified: Secondary | ICD-10-CM | POA: Diagnosis not present

## 2019-03-08 DIAGNOSIS — Z79899 Other long term (current) drug therapy: Secondary | ICD-10-CM | POA: Diagnosis not present

## 2019-03-08 DIAGNOSIS — Z87891 Personal history of nicotine dependence: Secondary | ICD-10-CM | POA: Diagnosis not present

## 2019-03-08 DIAGNOSIS — O019 Hydatidiform mole, unspecified: Secondary | ICD-10-CM | POA: Diagnosis not present

## 2019-03-09 DIAGNOSIS — O019 Hydatidiform mole, unspecified: Secondary | ICD-10-CM | POA: Diagnosis not present

## 2019-03-09 DIAGNOSIS — Z87891 Personal history of nicotine dependence: Secondary | ICD-10-CM | POA: Diagnosis not present

## 2019-03-09 DIAGNOSIS — Z79899 Other long term (current) drug therapy: Secondary | ICD-10-CM | POA: Diagnosis not present

## 2019-03-10 DIAGNOSIS — Z79899 Other long term (current) drug therapy: Secondary | ICD-10-CM | POA: Diagnosis not present

## 2019-03-10 DIAGNOSIS — O019 Hydatidiform mole, unspecified: Secondary | ICD-10-CM | POA: Diagnosis not present

## 2019-03-10 DIAGNOSIS — Z87891 Personal history of nicotine dependence: Secondary | ICD-10-CM | POA: Diagnosis not present

## 2019-03-11 DIAGNOSIS — O019 Hydatidiform mole, unspecified: Secondary | ICD-10-CM | POA: Diagnosis not present

## 2019-03-11 DIAGNOSIS — Z79899 Other long term (current) drug therapy: Secondary | ICD-10-CM | POA: Diagnosis not present

## 2019-03-11 DIAGNOSIS — Z87891 Personal history of nicotine dependence: Secondary | ICD-10-CM | POA: Diagnosis not present

## 2019-03-12 DIAGNOSIS — M797 Fibromyalgia: Secondary | ICD-10-CM | POA: Diagnosis not present

## 2019-03-12 DIAGNOSIS — O019 Hydatidiform mole, unspecified: Secondary | ICD-10-CM | POA: Diagnosis not present

## 2019-03-12 DIAGNOSIS — J45909 Unspecified asthma, uncomplicated: Secondary | ICD-10-CM | POA: Diagnosis not present

## 2019-03-12 DIAGNOSIS — Z452 Encounter for adjustment and management of vascular access device: Secondary | ICD-10-CM | POA: Diagnosis not present

## 2019-03-12 DIAGNOSIS — Z79899 Other long term (current) drug therapy: Secondary | ICD-10-CM | POA: Diagnosis not present

## 2019-03-12 DIAGNOSIS — Z87891 Personal history of nicotine dependence: Secondary | ICD-10-CM | POA: Diagnosis not present

## 2019-03-12 DIAGNOSIS — Z87442 Personal history of urinary calculi: Secondary | ICD-10-CM | POA: Diagnosis not present

## 2019-03-12 DIAGNOSIS — Z20828 Contact with and (suspected) exposure to other viral communicable diseases: Secondary | ICD-10-CM | POA: Diagnosis not present

## 2019-03-12 DIAGNOSIS — Z5111 Encounter for antineoplastic chemotherapy: Secondary | ICD-10-CM | POA: Diagnosis not present

## 2019-03-24 DIAGNOSIS — O019 Hydatidiform mole, unspecified: Secondary | ICD-10-CM | POA: Diagnosis not present

## 2019-03-24 DIAGNOSIS — Z8489 Family history of other specified conditions: Secondary | ICD-10-CM | POA: Diagnosis not present

## 2019-03-31 DIAGNOSIS — Z8489 Family history of other specified conditions: Secondary | ICD-10-CM | POA: Diagnosis not present

## 2019-03-31 DIAGNOSIS — O019 Hydatidiform mole, unspecified: Secondary | ICD-10-CM | POA: Diagnosis not present

## 2019-04-01 DIAGNOSIS — R001 Bradycardia, unspecified: Secondary | ICD-10-CM | POA: Diagnosis not present

## 2019-04-01 DIAGNOSIS — R55 Syncope and collapse: Secondary | ICD-10-CM | POA: Diagnosis not present

## 2019-04-01 DIAGNOSIS — I471 Supraventricular tachycardia: Secondary | ICD-10-CM | POA: Diagnosis not present

## 2019-04-05 DIAGNOSIS — O019 Hydatidiform mole, unspecified: Secondary | ICD-10-CM | POA: Diagnosis not present

## 2019-04-14 DIAGNOSIS — O019 Hydatidiform mole, unspecified: Secondary | ICD-10-CM | POA: Diagnosis not present

## 2019-04-14 DIAGNOSIS — Z79899 Other long term (current) drug therapy: Secondary | ICD-10-CM | POA: Diagnosis not present

## 2019-04-14 DIAGNOSIS — Z87891 Personal history of nicotine dependence: Secondary | ICD-10-CM | POA: Diagnosis not present

## 2019-04-14 DIAGNOSIS — Z5111 Encounter for antineoplastic chemotherapy: Secondary | ICD-10-CM | POA: Diagnosis not present

## 2019-04-15 DIAGNOSIS — R55 Syncope and collapse: Secondary | ICD-10-CM | POA: Diagnosis not present

## 2019-04-15 DIAGNOSIS — I471 Supraventricular tachycardia: Secondary | ICD-10-CM | POA: Diagnosis not present

## 2019-04-19 DIAGNOSIS — Z5111 Encounter for antineoplastic chemotherapy: Secondary | ICD-10-CM | POA: Diagnosis not present

## 2019-04-19 DIAGNOSIS — Z87891 Personal history of nicotine dependence: Secondary | ICD-10-CM | POA: Diagnosis not present

## 2019-04-19 DIAGNOSIS — O019 Hydatidiform mole, unspecified: Secondary | ICD-10-CM | POA: Diagnosis not present

## 2019-04-19 DIAGNOSIS — Z79899 Other long term (current) drug therapy: Secondary | ICD-10-CM | POA: Diagnosis not present

## 2019-04-20 DIAGNOSIS — O019 Hydatidiform mole, unspecified: Secondary | ICD-10-CM | POA: Diagnosis not present

## 2019-04-20 DIAGNOSIS — Z87891 Personal history of nicotine dependence: Secondary | ICD-10-CM | POA: Diagnosis not present

## 2019-04-20 DIAGNOSIS — Z79899 Other long term (current) drug therapy: Secondary | ICD-10-CM | POA: Diagnosis not present

## 2019-04-20 DIAGNOSIS — Z5111 Encounter for antineoplastic chemotherapy: Secondary | ICD-10-CM | POA: Diagnosis not present

## 2019-04-21 DIAGNOSIS — Z87891 Personal history of nicotine dependence: Secondary | ICD-10-CM | POA: Diagnosis not present

## 2019-04-21 DIAGNOSIS — Z5111 Encounter for antineoplastic chemotherapy: Secondary | ICD-10-CM | POA: Diagnosis not present

## 2019-04-21 DIAGNOSIS — O019 Hydatidiform mole, unspecified: Secondary | ICD-10-CM | POA: Diagnosis not present

## 2019-04-21 DIAGNOSIS — Z79899 Other long term (current) drug therapy: Secondary | ICD-10-CM | POA: Diagnosis not present

## 2019-04-22 DIAGNOSIS — Z79899 Other long term (current) drug therapy: Secondary | ICD-10-CM | POA: Diagnosis not present

## 2019-04-22 DIAGNOSIS — O019 Hydatidiform mole, unspecified: Secondary | ICD-10-CM | POA: Diagnosis not present

## 2019-04-22 DIAGNOSIS — Z87891 Personal history of nicotine dependence: Secondary | ICD-10-CM | POA: Diagnosis not present

## 2019-04-22 DIAGNOSIS — Z5111 Encounter for antineoplastic chemotherapy: Secondary | ICD-10-CM | POA: Diagnosis not present

## 2019-04-23 DIAGNOSIS — O019 Hydatidiform mole, unspecified: Secondary | ICD-10-CM | POA: Diagnosis not present

## 2019-04-23 DIAGNOSIS — Z5111 Encounter for antineoplastic chemotherapy: Secondary | ICD-10-CM | POA: Diagnosis not present

## 2019-04-23 DIAGNOSIS — Z87891 Personal history of nicotine dependence: Secondary | ICD-10-CM | POA: Diagnosis not present

## 2019-04-23 DIAGNOSIS — Z79899 Other long term (current) drug therapy: Secondary | ICD-10-CM | POA: Diagnosis not present

## 2019-04-30 DIAGNOSIS — Z87891 Personal history of nicotine dependence: Secondary | ICD-10-CM | POA: Diagnosis not present

## 2019-04-30 DIAGNOSIS — Z5111 Encounter for antineoplastic chemotherapy: Secondary | ICD-10-CM | POA: Diagnosis not present

## 2019-04-30 DIAGNOSIS — Z79899 Other long term (current) drug therapy: Secondary | ICD-10-CM | POA: Diagnosis not present

## 2019-04-30 DIAGNOSIS — O019 Hydatidiform mole, unspecified: Secondary | ICD-10-CM | POA: Diagnosis not present

## 2019-05-03 DIAGNOSIS — Z87891 Personal history of nicotine dependence: Secondary | ICD-10-CM | POA: Diagnosis not present

## 2019-05-03 DIAGNOSIS — Z79899 Other long term (current) drug therapy: Secondary | ICD-10-CM | POA: Diagnosis not present

## 2019-05-03 DIAGNOSIS — Z5111 Encounter for antineoplastic chemotherapy: Secondary | ICD-10-CM | POA: Diagnosis not present

## 2019-05-03 DIAGNOSIS — O019 Hydatidiform mole, unspecified: Secondary | ICD-10-CM | POA: Diagnosis not present

## 2019-05-04 DIAGNOSIS — Z87891 Personal history of nicotine dependence: Secondary | ICD-10-CM | POA: Diagnosis not present

## 2019-05-04 DIAGNOSIS — Z79899 Other long term (current) drug therapy: Secondary | ICD-10-CM | POA: Diagnosis not present

## 2019-05-04 DIAGNOSIS — Z5111 Encounter for antineoplastic chemotherapy: Secondary | ICD-10-CM | POA: Diagnosis not present

## 2019-05-04 DIAGNOSIS — O019 Hydatidiform mole, unspecified: Secondary | ICD-10-CM | POA: Diagnosis not present

## 2019-05-05 DIAGNOSIS — Z79899 Other long term (current) drug therapy: Secondary | ICD-10-CM | POA: Diagnosis not present

## 2019-05-05 DIAGNOSIS — Z87891 Personal history of nicotine dependence: Secondary | ICD-10-CM | POA: Diagnosis not present

## 2019-05-05 DIAGNOSIS — O019 Hydatidiform mole, unspecified: Secondary | ICD-10-CM | POA: Diagnosis not present

## 2019-05-05 DIAGNOSIS — Z5111 Encounter for antineoplastic chemotherapy: Secondary | ICD-10-CM | POA: Diagnosis not present

## 2019-05-06 DIAGNOSIS — Z87891 Personal history of nicotine dependence: Secondary | ICD-10-CM | POA: Diagnosis not present

## 2019-05-06 DIAGNOSIS — Z79899 Other long term (current) drug therapy: Secondary | ICD-10-CM | POA: Diagnosis not present

## 2019-05-06 DIAGNOSIS — O019 Hydatidiform mole, unspecified: Secondary | ICD-10-CM | POA: Diagnosis not present

## 2019-05-06 DIAGNOSIS — Z5111 Encounter for antineoplastic chemotherapy: Secondary | ICD-10-CM | POA: Diagnosis not present

## 2019-05-12 DIAGNOSIS — Z3A00 Weeks of gestation of pregnancy not specified: Secondary | ICD-10-CM | POA: Diagnosis not present

## 2019-05-12 DIAGNOSIS — O019 Hydatidiform mole, unspecified: Secondary | ICD-10-CM | POA: Diagnosis not present

## 2019-05-15 DIAGNOSIS — Z043 Encounter for examination and observation following other accident: Secondary | ICD-10-CM | POA: Diagnosis not present

## 2019-05-17 DIAGNOSIS — M25462 Effusion, left knee: Secondary | ICD-10-CM | POA: Diagnosis not present

## 2019-05-17 DIAGNOSIS — Z87891 Personal history of nicotine dependence: Secondary | ICD-10-CM | POA: Diagnosis not present

## 2019-05-17 DIAGNOSIS — Z793 Long term (current) use of hormonal contraceptives: Secondary | ICD-10-CM | POA: Diagnosis not present

## 2019-05-17 DIAGNOSIS — Z791 Long term (current) use of non-steroidal anti-inflammatories (NSAID): Secondary | ICD-10-CM | POA: Diagnosis not present

## 2019-05-17 DIAGNOSIS — Z8616 Personal history of COVID-19: Secondary | ICD-10-CM | POA: Diagnosis not present

## 2019-05-17 DIAGNOSIS — M25562 Pain in left knee: Secondary | ICD-10-CM | POA: Diagnosis not present

## 2019-05-17 DIAGNOSIS — Z3A00 Weeks of gestation of pregnancy not specified: Secondary | ICD-10-CM | POA: Diagnosis not present

## 2019-05-17 DIAGNOSIS — O019 Hydatidiform mole, unspecified: Secondary | ICD-10-CM | POA: Diagnosis not present

## 2019-05-17 DIAGNOSIS — M25569 Pain in unspecified knee: Secondary | ICD-10-CM | POA: Diagnosis not present

## 2019-05-18 DIAGNOSIS — Z8616 Personal history of COVID-19: Secondary | ICD-10-CM | POA: Diagnosis not present

## 2019-05-18 DIAGNOSIS — Z87891 Personal history of nicotine dependence: Secondary | ICD-10-CM | POA: Diagnosis not present

## 2019-05-18 DIAGNOSIS — Z3A00 Weeks of gestation of pregnancy not specified: Secondary | ICD-10-CM | POA: Diagnosis not present

## 2019-05-18 DIAGNOSIS — Z793 Long term (current) use of hormonal contraceptives: Secondary | ICD-10-CM | POA: Diagnosis not present

## 2019-05-18 DIAGNOSIS — M25569 Pain in unspecified knee: Secondary | ICD-10-CM | POA: Diagnosis not present

## 2019-05-18 DIAGNOSIS — O019 Hydatidiform mole, unspecified: Secondary | ICD-10-CM | POA: Diagnosis not present

## 2019-05-18 DIAGNOSIS — Z791 Long term (current) use of non-steroidal anti-inflammatories (NSAID): Secondary | ICD-10-CM | POA: Diagnosis not present

## 2019-05-19 DIAGNOSIS — O019 Hydatidiform mole, unspecified: Secondary | ICD-10-CM | POA: Diagnosis not present

## 2019-05-19 DIAGNOSIS — Z793 Long term (current) use of hormonal contraceptives: Secondary | ICD-10-CM | POA: Diagnosis not present

## 2019-05-19 DIAGNOSIS — M25569 Pain in unspecified knee: Secondary | ICD-10-CM | POA: Diagnosis not present

## 2019-05-19 DIAGNOSIS — Z791 Long term (current) use of non-steroidal anti-inflammatories (NSAID): Secondary | ICD-10-CM | POA: Diagnosis not present

## 2019-05-19 DIAGNOSIS — Z87891 Personal history of nicotine dependence: Secondary | ICD-10-CM | POA: Diagnosis not present

## 2019-05-19 DIAGNOSIS — Z3A00 Weeks of gestation of pregnancy not specified: Secondary | ICD-10-CM | POA: Diagnosis not present

## 2019-05-19 DIAGNOSIS — Z8616 Personal history of COVID-19: Secondary | ICD-10-CM | POA: Diagnosis not present

## 2019-05-20 DIAGNOSIS — Z791 Long term (current) use of non-steroidal anti-inflammatories (NSAID): Secondary | ICD-10-CM | POA: Diagnosis not present

## 2019-05-20 DIAGNOSIS — Z793 Long term (current) use of hormonal contraceptives: Secondary | ICD-10-CM | POA: Diagnosis not present

## 2019-05-20 DIAGNOSIS — Z3A00 Weeks of gestation of pregnancy not specified: Secondary | ICD-10-CM | POA: Diagnosis not present

## 2019-05-20 DIAGNOSIS — Z8616 Personal history of COVID-19: Secondary | ICD-10-CM | POA: Diagnosis not present

## 2019-05-20 DIAGNOSIS — Z87891 Personal history of nicotine dependence: Secondary | ICD-10-CM | POA: Diagnosis not present

## 2019-05-20 DIAGNOSIS — M25569 Pain in unspecified knee: Secondary | ICD-10-CM | POA: Diagnosis not present

## 2019-05-20 DIAGNOSIS — O019 Hydatidiform mole, unspecified: Secondary | ICD-10-CM | POA: Diagnosis not present

## 2019-05-21 DIAGNOSIS — Z791 Long term (current) use of non-steroidal anti-inflammatories (NSAID): Secondary | ICD-10-CM | POA: Diagnosis not present

## 2019-05-21 DIAGNOSIS — Z87891 Personal history of nicotine dependence: Secondary | ICD-10-CM | POA: Diagnosis not present

## 2019-05-21 DIAGNOSIS — M25569 Pain in unspecified knee: Secondary | ICD-10-CM | POA: Diagnosis not present

## 2019-05-21 DIAGNOSIS — O019 Hydatidiform mole, unspecified: Secondary | ICD-10-CM | POA: Diagnosis not present

## 2019-05-21 DIAGNOSIS — Z3A00 Weeks of gestation of pregnancy not specified: Secondary | ICD-10-CM | POA: Diagnosis not present

## 2019-05-21 DIAGNOSIS — Z793 Long term (current) use of hormonal contraceptives: Secondary | ICD-10-CM | POA: Diagnosis not present

## 2019-05-21 DIAGNOSIS — Z8616 Personal history of COVID-19: Secondary | ICD-10-CM | POA: Diagnosis not present

## 2019-05-26 DIAGNOSIS — O019 Hydatidiform mole, unspecified: Secondary | ICD-10-CM | POA: Diagnosis not present

## 2019-05-26 DIAGNOSIS — Z3A00 Weeks of gestation of pregnancy not specified: Secondary | ICD-10-CM | POA: Diagnosis not present

## 2019-05-31 DIAGNOSIS — Z793 Long term (current) use of hormonal contraceptives: Secondary | ICD-10-CM | POA: Diagnosis not present

## 2019-05-31 DIAGNOSIS — Z87891 Personal history of nicotine dependence: Secondary | ICD-10-CM | POA: Diagnosis not present

## 2019-05-31 DIAGNOSIS — Z3A00 Weeks of gestation of pregnancy not specified: Secondary | ICD-10-CM | POA: Diagnosis not present

## 2019-05-31 DIAGNOSIS — Z791 Long term (current) use of non-steroidal anti-inflammatories (NSAID): Secondary | ICD-10-CM | POA: Diagnosis not present

## 2019-05-31 DIAGNOSIS — O019 Hydatidiform mole, unspecified: Secondary | ICD-10-CM | POA: Diagnosis not present

## 2019-05-31 DIAGNOSIS — M25569 Pain in unspecified knee: Secondary | ICD-10-CM | POA: Diagnosis not present

## 2019-05-31 DIAGNOSIS — Z8616 Personal history of COVID-19: Secondary | ICD-10-CM | POA: Diagnosis not present

## 2019-06-01 DIAGNOSIS — O019 Hydatidiform mole, unspecified: Secondary | ICD-10-CM | POA: Diagnosis not present

## 2019-06-01 DIAGNOSIS — M25569 Pain in unspecified knee: Secondary | ICD-10-CM | POA: Diagnosis not present

## 2019-06-01 DIAGNOSIS — Z87891 Personal history of nicotine dependence: Secondary | ICD-10-CM | POA: Diagnosis not present

## 2019-06-01 DIAGNOSIS — Z793 Long term (current) use of hormonal contraceptives: Secondary | ICD-10-CM | POA: Diagnosis not present

## 2019-06-01 DIAGNOSIS — Z791 Long term (current) use of non-steroidal anti-inflammatories (NSAID): Secondary | ICD-10-CM | POA: Diagnosis not present

## 2019-06-01 DIAGNOSIS — Z3A00 Weeks of gestation of pregnancy not specified: Secondary | ICD-10-CM | POA: Diagnosis not present

## 2019-06-01 DIAGNOSIS — Z8616 Personal history of COVID-19: Secondary | ICD-10-CM | POA: Diagnosis not present

## 2019-06-02 DIAGNOSIS — O019 Hydatidiform mole, unspecified: Secondary | ICD-10-CM | POA: Diagnosis not present

## 2019-06-02 DIAGNOSIS — Z791 Long term (current) use of non-steroidal anti-inflammatories (NSAID): Secondary | ICD-10-CM | POA: Diagnosis not present

## 2019-06-02 DIAGNOSIS — Z3A00 Weeks of gestation of pregnancy not specified: Secondary | ICD-10-CM | POA: Diagnosis not present

## 2019-06-02 DIAGNOSIS — M25569 Pain in unspecified knee: Secondary | ICD-10-CM | POA: Diagnosis not present

## 2019-06-02 DIAGNOSIS — Z8616 Personal history of COVID-19: Secondary | ICD-10-CM | POA: Diagnosis not present

## 2019-06-02 DIAGNOSIS — Z793 Long term (current) use of hormonal contraceptives: Secondary | ICD-10-CM | POA: Diagnosis not present

## 2019-06-02 DIAGNOSIS — Z87891 Personal history of nicotine dependence: Secondary | ICD-10-CM | POA: Diagnosis not present

## 2019-06-03 DIAGNOSIS — M25569 Pain in unspecified knee: Secondary | ICD-10-CM | POA: Diagnosis not present

## 2019-06-03 DIAGNOSIS — O019 Hydatidiform mole, unspecified: Secondary | ICD-10-CM | POA: Diagnosis not present

## 2019-06-03 DIAGNOSIS — Z791 Long term (current) use of non-steroidal anti-inflammatories (NSAID): Secondary | ICD-10-CM | POA: Diagnosis not present

## 2019-06-03 DIAGNOSIS — Z87891 Personal history of nicotine dependence: Secondary | ICD-10-CM | POA: Diagnosis not present

## 2019-06-03 DIAGNOSIS — Z8616 Personal history of COVID-19: Secondary | ICD-10-CM | POA: Diagnosis not present

## 2019-06-03 DIAGNOSIS — Z793 Long term (current) use of hormonal contraceptives: Secondary | ICD-10-CM | POA: Diagnosis not present

## 2019-06-03 DIAGNOSIS — Z3A00 Weeks of gestation of pregnancy not specified: Secondary | ICD-10-CM | POA: Diagnosis not present

## 2019-06-04 DIAGNOSIS — Z3A00 Weeks of gestation of pregnancy not specified: Secondary | ICD-10-CM | POA: Diagnosis not present

## 2019-06-04 DIAGNOSIS — Z791 Long term (current) use of non-steroidal anti-inflammatories (NSAID): Secondary | ICD-10-CM | POA: Diagnosis not present

## 2019-06-04 DIAGNOSIS — Z87891 Personal history of nicotine dependence: Secondary | ICD-10-CM | POA: Diagnosis not present

## 2019-06-04 DIAGNOSIS — Z793 Long term (current) use of hormonal contraceptives: Secondary | ICD-10-CM | POA: Diagnosis not present

## 2019-06-04 DIAGNOSIS — Z8616 Personal history of COVID-19: Secondary | ICD-10-CM | POA: Diagnosis not present

## 2019-06-04 DIAGNOSIS — M25569 Pain in unspecified knee: Secondary | ICD-10-CM | POA: Diagnosis not present

## 2019-06-04 DIAGNOSIS — O019 Hydatidiform mole, unspecified: Secondary | ICD-10-CM | POA: Diagnosis not present

## 2019-06-09 DIAGNOSIS — O019 Hydatidiform mole, unspecified: Secondary | ICD-10-CM | POA: Diagnosis not present

## 2019-06-09 DIAGNOSIS — Z8616 Personal history of COVID-19: Secondary | ICD-10-CM | POA: Diagnosis not present

## 2019-06-09 DIAGNOSIS — Z793 Long term (current) use of hormonal contraceptives: Secondary | ICD-10-CM | POA: Diagnosis not present

## 2019-06-09 DIAGNOSIS — Z3A00 Weeks of gestation of pregnancy not specified: Secondary | ICD-10-CM | POA: Diagnosis not present

## 2019-06-09 DIAGNOSIS — M25569 Pain in unspecified knee: Secondary | ICD-10-CM | POA: Diagnosis not present

## 2019-06-09 DIAGNOSIS — Z791 Long term (current) use of non-steroidal anti-inflammatories (NSAID): Secondary | ICD-10-CM | POA: Diagnosis not present

## 2019-06-09 DIAGNOSIS — Z87891 Personal history of nicotine dependence: Secondary | ICD-10-CM | POA: Diagnosis not present

## 2019-06-14 DIAGNOSIS — O019 Hydatidiform mole, unspecified: Secondary | ICD-10-CM | POA: Diagnosis not present

## 2019-06-14 DIAGNOSIS — Z87891 Personal history of nicotine dependence: Secondary | ICD-10-CM | POA: Diagnosis not present

## 2019-06-16 ENCOUNTER — Ambulatory Visit (INDEPENDENT_AMBULATORY_CARE_PROVIDER_SITE_OTHER): Payer: Medicaid Other | Admitting: Cardiology

## 2019-06-16 ENCOUNTER — Encounter: Payer: Self-pay | Admitting: Cardiology

## 2019-06-16 ENCOUNTER — Other Ambulatory Visit: Payer: Self-pay

## 2019-06-16 VITALS — BP 92/50 | HR 91 | Ht 68.0 in | Wt 136.0 lb

## 2019-06-16 DIAGNOSIS — R55 Syncope and collapse: Secondary | ICD-10-CM | POA: Diagnosis not present

## 2019-06-16 DIAGNOSIS — Z79899 Other long term (current) drug therapy: Secondary | ICD-10-CM

## 2019-06-16 DIAGNOSIS — I471 Supraventricular tachycardia: Secondary | ICD-10-CM

## 2019-06-16 DIAGNOSIS — I959 Hypotension, unspecified: Secondary | ICD-10-CM

## 2019-06-16 NOTE — Patient Instructions (Signed)
Medication Instructions:  Your physician recommends that you continue on your current medications as directed. Please refer to the Current Medication list given to you today.  *If you need a refill on your cardiac medications before your next appointment, please call your pharmacy*  Lab Work: None If you have labs (blood work) drawn today and your tests are completely normal, you will receive your results only by: Marland Kitchen MyChart Message (if you have MyChart) OR . A paper copy in the mail If you have any lab test that is abnormal or we need to change your treatment, we will call you to review the results.  Testing/Procedures: None  Follow-Up: At [facility], you and your health needs are our priority.  As part of our continuing mission to provide you with exceptional heart care, we have created designated Provider Care Teams.  These Care Teams include your primary Cardiologist (physician) and Advanced Practice Providers (APPs -  Physician Assistants and Nurse Practitioners) who all work together to provide you with the care you need, when you need it.  Your next appointment:   3 month(s)  The format for your next appointment:   In Person  Provider:   Berniece Salines, DO  Other Instructions

## 2019-06-16 NOTE — Progress Notes (Signed)
Cardiology Office Note:    Date:  06/16/2019   ID:  Erica Welch, DOB Mar 27, 1986, MRN [id number]  PCP:  Madelaine Bhat., MD  Cardiologist:  Berniece Salines, DO  Electrophysiologist:  None   Referring MD: Madelaine Bhat., MD   Chief Complaint  Patient presents with  . Follow-up    History of Present Illness:    Erica Welch is a 34 y.o. female with a hx of paroxysmal AVNRT (diagnosed in 2013 after she was experiencing fluttering was placed on a Holter monitor and was found to have paroxysmal narrow complex supraventricular tachycardia) at that time she had a treadmill stress test however there was no exercise-induced arrhythmia and no evidence of ischemia.  The patient has never had EP studies or ablation, hyperlipidemia was last seen by Dr. Geraldo Pitter in September 2019 at this time he was for preoperative clearance.    In the interim she has been diagnosed with non-metastatic gestational trophoblastic neoplasia.  Follows with an oncologist at [place] recently completed her methotrexate chemotherapy and has not been started on dactinomycin.  Of note the patient reported a syncope episode in December where she was cooking breakfast for her family when she suddenly felt ill sat in the chair and eventually passed out.  She was taken to [facility] where she recovered with no reasonable etiology.  ZIO monitor was placed on the patient which I was able to see a brief report that showed minimal heart rate of 43 bpm, maximal heart rate 158, average heart rate 80 bpm.  Patient she had 2 episodes of ventricular tachycardia which lasted for 7 beats with the highest heart rate of 158 bpm.  There was rare PVCs and PAC.  She denies any palpitations lightheadedness or dizziness.  However she is noting significant fatigue on her chemotherapy.  Past Medical History:  Diagnosis Date  . AV nodal tachycardia ([place]) 2017  . Cubital tunnel syndrome 2017  . Dyspareunia due to medical  condition in female   . Endometriosis   . GERD (gastroesophageal reflux disease)   . Hyperlipidemia   . IBS (irritable bowel syndrome)   . Insomnia   . Kidney stone   . Memory loss   . Migraine   . Occipital neuralgia 2017  . Patellofemoral arthralgia of both knees   . Right flank pain 2017  . Rotator cuff tendinitis 2017  . Vitamin B12 deficiency   . Vitamin D deficiency     Past Surgical History:  Procedure Laterality Date  . ABDOMINAL SURGERY  2007,2010,2012,2014  . CESAREAN SECTION  03/19/2006  . CESAREAN SECTION  12/09/2008  . NASAL SEPTUM SURGERY  2013  . PELVIC LAPAROSCOPY  2012  . TUBAL LIGATION N/A   . TYMPANOPLASTY    . WISDOM TOOTH EXTRACTION      Current Medications: Current Meds  Medication Sig  . medroxyPROGESTERone (DEPO-PROVERA) 150 MG/ML injection Inject into the muscle.  . ondansetron (ZOFRAN) 8 MG tablet 8 mg as needed.  . prochlorperazine (COMPAZINE) 10 MG tablet Take 10 mg by mouth every 6 (six) hours as needed.     Allergies:   Latex, Strawberry extract, and Other   Social History   Socioeconomic History  . Marital status: Married    Spouse name: Not on file  . Number of children: Not on file  . Years of education: Not on file  . Highest education level: Not on file  Occupational History  . Not on file  Tobacco Use  .  Smoking status: Never Smoker  . Smokeless tobacco: Never Used  Substance and Sexual Activity  . Alcohol use: Not on file  . Drug use: Not on file  . Sexual activity: Not on file  Other Topics Concern  . Not on file  Social History Narrative  . Not on file   Social Determinants of Health   Financial Resource Strain:   . Difficulty of Paying Living Expenses: Not on file  Food Insecurity:   . Worried About Charity fundraiser in the Last Year: Not on file  . Ran Out of Food in the Last Year: Not on file  Transportation Needs:   . Lack of Transportation (Medical): Not on file  . Lack of Transportation (Non-Medical):  Not on file  Physical Activity:   . Days of Exercise per Week: Not on file  . Minutes of Exercise per Session: Not on file  Stress:   . Feeling of Stress : Not on file  Social Connections:   . Frequency of Communication with Friends and Family: Not on file  . Frequency of Social Gatherings with Friends and Family: Not on file  . Attends Religious Services: Not on file  . Active Member of Clubs or Organizations: Not on file  . Attends Archivist Meetings: Not on file  . Marital Status: Not on file     Family History: The patient's family history is not on file.  ROS:   Review of Systems  Constitution: Negative for decreased appetite, fever and weight gain.  HENT: Negative for congestion, ear discharge, hoarse voice and sore throat.   Eyes: Negative for discharge, redness, vision loss in right eye and visual halos.  Cardiovascular: Negative for chest pain, dyspnea on exertion, leg swelling, orthopnea and palpitations.  Respiratory: Negative for cough, hemoptysis, shortness of breath and snoring.   Endocrine: Negative for heat intolerance and polyphagia.  Hematologic/Lymphatic: Negative for bleeding problem. Does not bruise/bleed easily.  Skin: Negative for flushing, nail changes, rash and suspicious lesions.  Musculoskeletal: Negative for arthritis, joint pain, muscle cramps, myalgias, neck pain and stiffness.  Gastrointestinal: Negative for abdominal pain, bowel incontinence, diarrhea and excessive appetite.  Genitourinary: Negative for decreased libido, genital sores and incomplete emptying.  Neurological: Negative for brief paralysis, focal weakness, headaches and loss of balance.  Psychiatric/Behavioral: Negative for altered mental status, depression and suicidal ideas.  Allergic/Immunologic: Negative for HIV exposure and persistent infections.    EKGs/Labs/Other Studies Reviewed:    The following studies were reviewed today:   EKG:  The ekg ordered today  demonstrates sinus rhythm, heart rate 85 bpm with arrhythmia, P with mild suggesting right atrial management.   Transthoracic echocardiogram 01/30/2018 study Conclusions  Left ventricle: The cavity size was normal. Wall thickness was normal. Systolic function was normal. The estimated ejection fraction was in the range of 55% to 60%. Wall motion was normal; there were no regional wall motion abnormalities. Left ventricular diastolic function parameters were normal.   Mitral valve: There was mild regurgitation.   Recent Labs: No results found for requested labs within last 8760 hours.  Recent Lipid Panel No results found for: CHOL, TRIG, HDL, CHOLHDL, VLDL, LDLCALC, LDLDIRECT  Physical Exam:    VS:  BP (!) 92/50 (BP Location: Right Arm, Patient Position: Sitting, Cuff Size: Normal)   Pulse 91   Ht 5\' 8"  (1.727 m)   Wt 136 lb (61.7 kg)   SpO2 98%   BMI 20.68 kg/m     Wt Readings from  Last 3 Encounters:  06/16/19 136 lb (61.7 kg)  01/21/18 154 lb (69.9 kg)  12/29/17 154 lb (69.9 kg)     GEN: Well nourished, well developed in no acute distress HEENT: Normal NECK: No JVD; No carotid bruits LYMPHATICS: No lymphadenopathy CARDIAC: S1S2 noted,RRR, no murmurs, rubs, gallops RESPIRATORY:  Clear to auscultation without rales, wheezing or rhonchi  ABDOMEN: Soft, non-tender, non-distended, +bowel sounds, no guarding. EXTREMITIES: No edema, No cyanosis, no clubbing, left arm with Chemo-Port. MUSCULOSKELETAL:  No deformity  SKIN: Warm and dry NEUROLOGIC:  Alert and oriented x 3, non-focal PSYCHIATRIC:  Normal affect, good insight  ASSESSMENT:    1. Syncope and collapse   2. AV nodal tachycardia ([place])   3. Hypotension, unspecified hypotension type   4. On antineoplastic chemotherapy    PLAN:    1.  She is hypotensive in the office today systolic blood pressure of 92/50 millimeters mercury manually taken by me.  I did go back and review her blood pressure on her most recent visit at  [facility] at that time her systolic was in the [id number] mmHg.she states that her blood pressure has been on the lower sinus and chemotherapy.  She denies any lightheadedness, or dizziness.  I encouraged the patient to take more fluid intake.  2.  Syncope-based on the patient's history of the sequence of events I suspect that this was vasovagal syncope.  For now we will continue to monitor her.  3.  She is getting active chemotherapy, she is completed methotrexate therapy and now is getting dactinomycin.  She should be completed his therapy in 6 weeks.  Of educated the patient I like to see her in 3 months update her LV systolic function  by repeating a transthoracic echocardiogram.  She also the labs for lipid profile and LFTs as well.  The patient is in agreement with the above plan. The patient left the office in stable condition.  The patient will follow up in 3 months or sooner if needed.   Medication Adjustments/Labs and Tests Ordered: Current medicines are reviewed at length with the patient today.  Concerns regarding medicines are outlined above.  Orders Placed This Encounter  Procedures  . EKG 12-Lead   No orders of the defined types were placed in this encounter.   Patient Instructions  Medication Instructions:  Your physician recommends that you continue on your current medications as directed. Please refer to the Current Medication list given to you today.  *If you need a refill on your cardiac medications before your next appointment, please call your pharmacy*  Lab Work: None If you have labs (blood work) drawn today and your tests are completely normal, you will receive your results only by: Marland Kitchen MyChart Message (if you have MyChart) OR . A paper copy in the mail If you have any lab test that is abnormal or we need to change your treatment, we will call you to review the results.  Testing/Procedures: None  Follow-Up: At [facility], you and your health needs are our  priority.  As part of our continuing mission to provide you with exceptional heart care, we have created designated Provider Care Teams.  These Care Teams include your primary Cardiologist (physician) and Advanced Practice Providers (APPs -  Physician Assistants and Nurse Practitioners) who all work together to provide you with the care you need, when you need it.  Your next appointment:   3 month(s)  The format for your next appointment:   In Person  Provider:   Berniece Salines, DO  Other Instructions      Adopting a Healthy Lifestyle.  Know what a healthy weight is for you (roughly BMI <25) and aim to maintain this   Aim for 7+ servings of fruits and vegetables daily   65-80+ fluid ounces of water or unsweet tea for healthy kidneys   Limit to max 1 drink of alcohol per day; avoid smoking/tobacco   Limit animal fats in diet for cholesterol and heart health - choose grass fed whenever available   Avoid highly processed foods, and foods high in saturated/trans fats   Aim for low stress - take time to unwind and care for your mental health   Aim for 150 min of moderate intensity exercise weekly for heart health, and weights twice weekly for bone health   Aim for 7-9 hours of sleep daily   When it comes to diets, agreement about the perfect plan isnt easy to find, even among the experts. Experts at the [employer] developed an idea known as the Healthy Eating Plate. Just imagine a plate divided into logical, healthy portions.   The emphasis is on diet quality:   Load up on vegetables and fruits - one-half of your plate: Aim for color and variety, and remember that potatoes dont count.   Go for whole grains - one-quarter of your plate: Whole wheat, barley, wheat berries, quinoa, oats, brown rice, and foods made with them. If you want pasta, go with whole wheat pasta.   Protein power - one-quarter of your plate: Fish, chicken, beans, and nuts are all healthy,  versatile protein sources. Limit red meat.   The diet, however, does go beyond the plate, offering a few other suggestions.   Use healthy plant oils, such as olive, canola, soy, corn, sunflower and peanut. Check the labels, and avoid partially hydrogenated oil, which have unhealthy trans fats.   If youre thirsty, drink water. Coffee and tea are good in moderation, but skip sugary drinks and limit milk and dairy products to one or two daily servings.   The type of carbohydrate in the diet is more important than the amount. Some sources of carbohydrates, such as vegetables, fruits, whole grains, and beans-are healthier than others.   Finally, stay active  Signed, Berniece Salines, DO  06/16/2019 12:05 PM    [place]

## 2019-06-17 DIAGNOSIS — O019 Hydatidiform mole, unspecified: Secondary | ICD-10-CM | POA: Diagnosis not present

## 2019-06-21 DIAGNOSIS — O019 Hydatidiform mole, unspecified: Secondary | ICD-10-CM | POA: Diagnosis not present

## 2019-06-21 DIAGNOSIS — Z87891 Personal history of nicotine dependence: Secondary | ICD-10-CM | POA: Diagnosis not present

## 2019-06-25 DIAGNOSIS — O99350 Diseases of the nervous system complicating pregnancy, unspecified trimester: Secondary | ICD-10-CM | POA: Diagnosis not present

## 2019-06-25 DIAGNOSIS — Z91040 Latex allergy status: Secondary | ICD-10-CM | POA: Diagnosis not present

## 2019-06-25 DIAGNOSIS — O99280 Endocrine, nutritional and metabolic diseases complicating pregnancy, unspecified trimester: Secondary | ICD-10-CM | POA: Diagnosis not present

## 2019-06-25 DIAGNOSIS — Z8742 Personal history of other diseases of the female genital tract: Secondary | ICD-10-CM | POA: Diagnosis not present

## 2019-06-25 DIAGNOSIS — O99891 Other specified diseases and conditions complicating pregnancy: Secondary | ICD-10-CM | POA: Diagnosis not present

## 2019-06-25 DIAGNOSIS — Z791 Long term (current) use of non-steroidal anti-inflammatories (NSAID): Secondary | ICD-10-CM | POA: Diagnosis not present

## 2019-06-25 DIAGNOSIS — O99519 Diseases of the respiratory system complicating pregnancy, unspecified trimester: Secondary | ICD-10-CM | POA: Diagnosis not present

## 2019-06-25 DIAGNOSIS — Z452 Encounter for adjustment and management of vascular access device: Secondary | ICD-10-CM | POA: Diagnosis not present

## 2019-06-25 DIAGNOSIS — M797 Fibromyalgia: Secondary | ICD-10-CM | POA: Diagnosis not present

## 2019-06-25 DIAGNOSIS — O019 Hydatidiform mole, unspecified: Secondary | ICD-10-CM | POA: Diagnosis not present

## 2019-06-25 DIAGNOSIS — I471 Supraventricular tachycardia: Secondary | ICD-10-CM | POA: Diagnosis not present

## 2019-06-25 DIAGNOSIS — Z91048 Other nonmedicinal substance allergy status: Secondary | ICD-10-CM | POA: Diagnosis not present

## 2019-06-25 DIAGNOSIS — O99419 Diseases of the circulatory system complicating pregnancy, unspecified trimester: Secondary | ICD-10-CM | POA: Diagnosis not present

## 2019-06-25 DIAGNOSIS — J45909 Unspecified asthma, uncomplicated: Secondary | ICD-10-CM | POA: Diagnosis not present

## 2019-06-25 DIAGNOSIS — G43109 Migraine with aura, not intractable, without status migrainosus: Secondary | ICD-10-CM | POA: Diagnosis not present

## 2019-06-25 DIAGNOSIS — E785 Hyperlipidemia, unspecified: Secondary | ICD-10-CM | POA: Diagnosis not present

## 2019-06-28 DIAGNOSIS — Z87891 Personal history of nicotine dependence: Secondary | ICD-10-CM | POA: Diagnosis not present

## 2019-06-28 DIAGNOSIS — O019 Hydatidiform mole, unspecified: Secondary | ICD-10-CM | POA: Diagnosis not present

## 2019-06-29 DIAGNOSIS — K5989 Other specified functional intestinal disorders: Secondary | ICD-10-CM | POA: Diagnosis not present

## 2019-06-29 DIAGNOSIS — Z8759 Personal history of other complications of pregnancy, childbirth and the puerperium: Secondary | ICD-10-CM | POA: Diagnosis not present

## 2019-06-29 DIAGNOSIS — R918 Other nonspecific abnormal finding of lung field: Secondary | ICD-10-CM | POA: Diagnosis not present

## 2019-06-29 DIAGNOSIS — R160 Hepatomegaly, not elsewhere classified: Secondary | ICD-10-CM | POA: Diagnosis not present

## 2019-07-04 DIAGNOSIS — Z20828 Contact with and (suspected) exposure to other viral communicable diseases: Secondary | ICD-10-CM | POA: Diagnosis not present

## 2019-07-05 ENCOUNTER — Other Ambulatory Visit: Payer: Self-pay

## 2019-07-05 ENCOUNTER — Ambulatory Visit: Payer: Medicaid Other | Admitting: Family Medicine

## 2019-07-05 ENCOUNTER — Encounter: Payer: Self-pay | Admitting: Family Medicine

## 2019-07-05 VITALS — BP 116/72 | Ht 68.0 in | Wt 137.0 lb

## 2019-07-05 DIAGNOSIS — M87059 Idiopathic aseptic necrosis of unspecified femur: Secondary | ICD-10-CM | POA: Diagnosis not present

## 2019-07-05 DIAGNOSIS — O019 Hydatidiform mole, unspecified: Secondary | ICD-10-CM | POA: Diagnosis not present

## 2019-07-05 DIAGNOSIS — Z87891 Personal history of nicotine dependence: Secondary | ICD-10-CM | POA: Diagnosis not present

## 2019-07-05 NOTE — Patient Instructions (Signed)
I will contact one of the surgeons to discuss your case before we take additional steps - this may mean referring you to the surgeon or doing dedicated hip imaging given we only see a little bit with the CT scan.

## 2019-07-05 NOTE — Progress Notes (Signed)
PCP: Madelaine Bhat., MD  Subjective:   HPI: Patient is a 34 y.o. female here for bilateral hip pain.  Patient has had joint aches and pains for several years. She denies new onset pain in bilateral hips or groin areas. No new injuries as well except for that to her left knee - felt like she slipped and twisted her left knee beginning of January. 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 Reports years of popping, locking as well of hips. Taking tylenol as needed for pain.  Past Medical History:  Diagnosis Date  . AV nodal tachycardia ([place]) 2017  . Cubital tunnel syndrome 2017  . Dyspareunia due to medical condition in female   . Endometriosis   . GERD (gastroesophageal reflux disease)   . Hyperlipidemia   . IBS (irritable bowel syndrome)   . Insomnia   . Kidney stone   . Memory loss   . Migraine   . Occipital neuralgia 2017  . Patellofemoral arthralgia of both knees   . Right flank pain 2017  . Rotator cuff tendinitis 2017  . Vitamin B12 deficiency   . Vitamin D deficiency     Current Outpatient Medications on File Prior to Visit  Medication Sig Dispense Refill  . medroxyPROGESTERone (DEPO-PROVERA) 150 MG/ML injection Inject into the muscle.    . ondansetron (ZOFRAN) 8 MG tablet 8 mg as needed.    . prochlorperazine (COMPAZINE) 10 MG tablet Take 10 mg by mouth every 6 (six) hours as needed.     No current facility-administered medications on file prior to visit.    Past Surgical History:  Procedure Laterality Date  . ABDOMINAL SURGERY  2007,2010,2012,2014  . CESAREAN SECTION  03/19/2006  . CESAREAN SECTION  12/09/2008  . NASAL SEPTUM SURGERY  2013  . PELVIC LAPAROSCOPY  2012  . TUBAL LIGATION N/A   . TYMPANOPLASTY    . WISDOM TOOTH EXTRACTION      Allergies  Allergen Reactions  . Latex Dermatitis  . Strawberry Extract Hives  . Other Itching     Social History   Socioeconomic History  . Marital status: Married    Spouse name: Not on file  . Number of children: Not on file  . Years of education: Not on file  . Highest education level: Not on file  Occupational History  . Not on file  Tobacco Use  . Smoking status: Never Smoker  . Smokeless tobacco: Never Used  Substance and Sexual Activity  . Alcohol use: Not on file  . Drug use: Not on file  . Sexual activity: Not on file  Other Topics Concern  . Not on file  Social History Narrative  . Not on file   Social Determinants of Health   Financial Resource Strain:   . Difficulty of Paying Living Expenses: Not on file  Food Insecurity:   . Worried About Charity fundraiser in the Last Year: Not on file  . Ran Out of Food in the Last Year: Not on file  Transportation Needs:   . Lack of Transportation (Medical): Not on file  . Lack of Transportation (Non-Medical): Not on file  Physical Activity:   . Days of Exercise per Week: Not on file  . Minutes of Exercise per Session: Not on file  Stress:   . Feeling of Stress : Not on file  Social Connections:   . Frequency of  Communication with Friends and Family: Not on file  . Frequency of Social Gatherings with Friends and Family: Not on file  . Attends Religious Services: Not on file  . Active Member of Clubs or Organizations: Not on file  . Attends Archivist Meetings: Not on file  . Marital Status: Not on file  Intimate Partner Violence:   . Fear of Current or Ex-Partner: Not on file  . Emotionally Abused: Not on file  . Physically Abused: Not on file  . Sexually Abused: Not on file    History reviewed. No pertinent family history.  BP 116/72   Ht 5\' 8"  (1.727 m)   Wt 137 lb (62.1 kg)   BMI 20.83 kg/m   Review of Systems: See HPI above.     Objective:  Physical Exam:  Gen: NAD, comfortable in exam room  Bilateral hips: No deformity. FROM with 5/5 strength all directions. No tenderness  to palpation. NVI distally. Negative faber, fadir, piriformis stretches. Negative logroll.  Gait normal without abnormalities   Assessment & Plan:  1. Bilateral hip pain - CT scan reviewed from outside facility noting probable AVN without collapse.  No acute change in her clinical status to correlate with this.  She also has normal hip motion and no pain with logroll, fadir, and lack of pain with gait.  She does report pain in hips by end of work day.  Discussed case with Dr. Ninfa Linden - will monitor with radiographs in 3 months or if pain progresses to be seen sooner.  Will give her option as well of following up with Dr. Ninfa Linden in the office for this.

## 2019-07-07 DIAGNOSIS — O019 Hydatidiform mole, unspecified: Secondary | ICD-10-CM | POA: Diagnosis not present

## 2019-07-12 DIAGNOSIS — Z791 Long term (current) use of non-steroidal anti-inflammatories (NSAID): Secondary | ICD-10-CM | POA: Diagnosis not present

## 2019-07-12 DIAGNOSIS — Z79899 Other long term (current) drug therapy: Secondary | ICD-10-CM | POA: Diagnosis not present

## 2019-07-12 DIAGNOSIS — M879 Osteonecrosis, unspecified: Secondary | ICD-10-CM | POA: Diagnosis not present

## 2019-07-12 DIAGNOSIS — O26899 Other specified pregnancy related conditions, unspecified trimester: Secondary | ICD-10-CM | POA: Diagnosis not present

## 2019-07-12 DIAGNOSIS — O019 Hydatidiform mole, unspecified: Secondary | ICD-10-CM | POA: Diagnosis not present

## 2019-07-12 DIAGNOSIS — Z87891 Personal history of nicotine dependence: Secondary | ICD-10-CM | POA: Diagnosis not present

## 2019-07-12 DIAGNOSIS — Z793 Long term (current) use of hormonal contraceptives: Secondary | ICD-10-CM | POA: Diagnosis not present

## 2019-07-12 DIAGNOSIS — R55 Syncope and collapse: Secondary | ICD-10-CM | POA: Diagnosis not present

## 2019-07-19 DIAGNOSIS — O019 Hydatidiform mole, unspecified: Secondary | ICD-10-CM | POA: Diagnosis not present

## 2019-07-19 DIAGNOSIS — Z87891 Personal history of nicotine dependence: Secondary | ICD-10-CM | POA: Diagnosis not present

## 2019-07-19 DIAGNOSIS — Z95828 Presence of other vascular implants and grafts: Secondary | ICD-10-CM | POA: Diagnosis not present

## 2019-07-26 DIAGNOSIS — Z95828 Presence of other vascular implants and grafts: Secondary | ICD-10-CM | POA: Diagnosis not present

## 2019-07-26 DIAGNOSIS — O019 Hydatidiform mole, unspecified: Secondary | ICD-10-CM | POA: Diagnosis not present

## 2019-07-26 DIAGNOSIS — Z87891 Personal history of nicotine dependence: Secondary | ICD-10-CM | POA: Diagnosis not present

## 2019-08-03 DIAGNOSIS — O019 Hydatidiform mole, unspecified: Secondary | ICD-10-CM | POA: Diagnosis not present

## 2019-08-09 DIAGNOSIS — Z95828 Presence of other vascular implants and grafts: Secondary | ICD-10-CM | POA: Diagnosis not present

## 2019-08-09 DIAGNOSIS — Z87891 Personal history of nicotine dependence: Secondary | ICD-10-CM | POA: Diagnosis not present

## 2019-08-09 DIAGNOSIS — Z30013 Encounter for initial prescription of injectable contraceptive: Secondary | ICD-10-CM | POA: Diagnosis not present

## 2019-08-09 DIAGNOSIS — O019 Hydatidiform mole, unspecified: Secondary | ICD-10-CM | POA: Diagnosis not present

## 2019-08-16 DIAGNOSIS — Z95828 Presence of other vascular implants and grafts: Secondary | ICD-10-CM | POA: Diagnosis not present

## 2019-08-16 DIAGNOSIS — O019 Hydatidiform mole, unspecified: Secondary | ICD-10-CM | POA: Diagnosis not present

## 2019-09-06 ENCOUNTER — Other Ambulatory Visit: Payer: Self-pay

## 2019-09-06 DIAGNOSIS — O019 Hydatidiform mole, unspecified: Secondary | ICD-10-CM | POA: Diagnosis not present

## 2019-09-06 DIAGNOSIS — Z95828 Presence of other vascular implants and grafts: Secondary | ICD-10-CM | POA: Diagnosis not present

## 2019-09-06 DIAGNOSIS — M87059 Idiopathic aseptic necrosis of unspecified femur: Secondary | ICD-10-CM

## 2019-09-06 NOTE — Telephone Encounter (Signed)
Please set her up to see Dr. Ninfa Linden for bilateral hip AVN.  He's aware of the patient (talked to him about her about 2 months ago).  Thanks!

## 2019-09-06 NOTE — Progress Notes (Signed)
.  ort 

## 2019-09-13 ENCOUNTER — Other Ambulatory Visit: Payer: Self-pay

## 2019-09-13 ENCOUNTER — Ambulatory Visit (INDEPENDENT_AMBULATORY_CARE_PROVIDER_SITE_OTHER): Payer: Medicaid Other

## 2019-09-13 ENCOUNTER — Ambulatory Visit (INDEPENDENT_AMBULATORY_CARE_PROVIDER_SITE_OTHER): Payer: Medicaid Other | Admitting: Orthopaedic Surgery

## 2019-09-13 ENCOUNTER — Encounter: Payer: Self-pay | Admitting: Orthopaedic Surgery

## 2019-09-13 ENCOUNTER — Ambulatory Visit: Payer: Self-pay

## 2019-09-13 DIAGNOSIS — M25552 Pain in left hip: Secondary | ICD-10-CM

## 2019-09-13 DIAGNOSIS — M25551 Pain in right hip: Secondary | ICD-10-CM | POA: Diagnosis not present

## 2019-09-13 NOTE — Progress Notes (Signed)
Office Visit Note   Patient: Erica Welch           Date of Birth: 30-Dec-1985           MRN: [id number] Visit Date: 09/13/2019              Requested by: Dene Gentry, MD [address] [place],  [place] [address] PCP: Madelaine Bhat., MD   Assessment & Plan: Visit Diagnoses:  1. Pain in left hip   2. Pain in right hip     Plan: Given no history of being on chemotherapy extensively as well as steroids extensively in the past, there is the potential for avascular necrosis with her hips.  The concern is the fact that she is having groin pain and at least her left hip plain films shows the potential for cystic changes in the femoral head.  With that being said, a MRI is warranted of her pelvis so we can assess both hips to assess for the possibility of avascular necrosis.  I would not recommend a steroid injection in either hip based on the history because this can certainly accelerate and worsen the situation of avascular necrosis.  All questions and concerns were answered and addressed.  She agrees with this treatment plan.  Although her CT scan report suggests avascular necrosis of both hips without femoral head collapse, a MRI would be more important in helping assess if there is edema and the potential for subchondral collapse that could help potentially guide the need for surgery for her hips.  Follow-Up Instructions: Return in about 2 weeks (around 09/27/2019).   Orders:  Orders Placed This Encounter  Procedures  . XR HIP UNILAT W OR W/O PELVIS 1V LEFT  . XR HIP UNILAT W OR W/O PELVIS 1V RIGHT   No orders of the defined types were placed in this encounter.     Procedures: No procedures performed   Clinical Data: No additional findings.   Subjective: Chief Complaint  Patient presents with  . Left Hip - Pain  . Right Hip - Pain  The patient is a 34 year old female that is referred to me for the possibility of avascular necrosis involving both her hips.  She has  had on and off again hip and groin pain that is really gotten worse over the last several months and has become a constant aching pain.  She does have a history of uterine cancer and was on chemotherapy for a significant bout of time.  She is also been on significant steroids over the years.  The hip pain is only in her groin on both sides.  She denies any injuries.  She has never had surgery on her hips.  She is a very thin individual.  She does not use or abuse alcohol.  She is not a diabetic.  HPI  Review of Systems She currently denies any headache, chest pain, shortness of breath, fever, chills, nausea, vomiting  Objective: Vital Signs: There were no vitals taken for this visit.  Physical Exam She is alert and orient x3 and in no acute distress Ortho Exam Examination of both hips show that there are no blocks rotation and no significant stiffness in either hip but she does hurt in the groin throughout the arc of rotation of both hips. Specialty Comments:  No specialty comments available.  Imaging: XR HIP UNILAT W OR W/O PELVIS 1V LEFT  Result Date: 09/13/2019 2 views of the left hip shows questionable cystic change  in the femoral head suggesting the possibility of avascular necrosis.  The hip joint space is well-maintained.  XR HIP UNILAT W OR W/O PELVIS 1V RIGHT  Result Date: 09/13/2019 2 views of the right hip show no acute findings.  The hip joint is well located and in good position.  There is no cortical irregularities or cystic changes that can be seen.  A CT scan report that was done recently did does show her pelvis says that there is avascular necrosis of both femoral heads without subchondral collapse.  I do not have that actual CT scan but I do have the report.  PMFS History: Patient Active Problem List   Diagnosis Date Noted  . AVNRT (AV nodal re-entry tachycardia) ([place]) 01/21/2018  . Preop cardiovascular exam 01/21/2018  . Left foot pain 06/19/2017  . Fibromyalgia  05/20/2017  . Right knee injury, subsequent encounter 05/20/2017  . Right ankle injury, initial encounter 05/20/2017  . Chronic pain of left knee 07/07/2015  . Anxiety 07/05/2015  . Arthritis, multiple joint involvement 07/05/2015  . Asthma 07/05/2015  . AV nodal tachycardia ([place]) 07/05/2015  . Endometriosis 07/05/2015  . GERD without esophagitis 07/05/2015  . Hyperlipidemia, unspecified 07/05/2015  . Irritable bowel syndrome without diarrhea 07/05/2015  . Lung nodule 07/05/2015  . Fatty liver 09/07/2014  . Kidney stone 10/29/2012  . 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 AV nodal tachycardia ([place]) 2017  . Cubital tunnel syndrome 2017  . Dyspareunia due to medical condition in female   . Endometriosis   . GERD (gastroesophageal reflux disease)   . Hyperlipidemia   . IBS (irritable bowel syndrome)   . Insomnia   . Kidney stone   . Memory loss   . Migraine   . Occipital neuralgia 2017  . Patellofemoral arthralgia of both knees   . Right flank pain 2017  . Rotator cuff tendinitis 2017  . Vitamin B12 deficiency   . Vitamin D deficiency     History reviewed. No pertinent family history.  Past Surgical History:  Procedure Laterality Date  . ABDOMINAL SURGERY  2007,2010,2012,2014  . CESAREAN SECTION  03/19/2006  . CESAREAN SECTION  12/09/2008  . NASAL SEPTUM SURGERY  2013  . PELVIC LAPAROSCOPY  2012  . TUBAL LIGATION N/A   . TYMPANOPLASTY    . WISDOM TOOTH EXTRACTION     Social History   Occupational History  . Not on file  Tobacco Use  . Smoking status: Never Smoker  . Smokeless tobacco: Never Used  Substance and Sexual Activity  . Alcohol use: Not on file  . Drug use: Not on file  . Sexual activity: Not on file

## 2019-09-26 ENCOUNTER — Other Ambulatory Visit: Payer: Self-pay

## 2019-09-26 ENCOUNTER — Ambulatory Visit
Admission: RE | Admit: 2019-09-26 | Discharge: 2019-09-26 | Disposition: A | Discharge status: Home / Self Care | Payer: Medicaid Other | Source: Ambulatory Visit | Attending: Orthopaedic Surgery | Admitting: Orthopaedic Surgery

## 2019-09-26 DIAGNOSIS — M25552 Pain in left hip: Secondary | ICD-10-CM | POA: Diagnosis not present

## 2019-09-26 DIAGNOSIS — M25551 Pain in right hip: Secondary | ICD-10-CM

## 2019-09-28 ENCOUNTER — Ambulatory Visit: Payer: Medicaid Other | Admitting: Orthopaedic Surgery

## 2019-10-04 ENCOUNTER — Encounter: Payer: Self-pay | Admitting: Orthopaedic Surgery

## 2019-10-04 ENCOUNTER — Ambulatory Visit (INDEPENDENT_AMBULATORY_CARE_PROVIDER_SITE_OTHER): Payer: Medicaid Other | Admitting: Orthopaedic Surgery

## 2019-10-04 ENCOUNTER — Ambulatory Visit: Payer: Medicaid Other | Admitting: Cardiology

## 2019-10-04 ENCOUNTER — Other Ambulatory Visit: Payer: Self-pay

## 2019-10-04 ENCOUNTER — Ambulatory Visit (INDEPENDENT_AMBULATORY_CARE_PROVIDER_SITE_OTHER): Payer: Medicaid Other | Admitting: Cardiology

## 2019-10-04 ENCOUNTER — Encounter: Payer: Self-pay | Admitting: Cardiology

## 2019-10-04 ENCOUNTER — Ambulatory Visit (INDEPENDENT_AMBULATORY_CARE_PROVIDER_SITE_OTHER): Payer: Medicaid Other

## 2019-10-04 VITALS — BP 100/58 | HR 86 | Ht 68.0 in | Wt 144.0 lb

## 2019-10-04 DIAGNOSIS — R55 Syncope and collapse: Secondary | ICD-10-CM

## 2019-10-04 DIAGNOSIS — O019 Hydatidiform mole, unspecified: Secondary | ICD-10-CM

## 2019-10-04 DIAGNOSIS — I471 Supraventricular tachycardia: Secondary | ICD-10-CM | POA: Diagnosis not present

## 2019-10-04 DIAGNOSIS — M87051 Idiopathic aseptic necrosis of right femur: Secondary | ICD-10-CM | POA: Insufficient documentation

## 2019-10-04 DIAGNOSIS — M87052 Idiopathic aseptic necrosis of left femur: Secondary | ICD-10-CM | POA: Diagnosis not present

## 2019-10-04 DIAGNOSIS — I951 Orthostatic hypotension: Secondary | ICD-10-CM

## 2019-10-04 MED ORDER — FLUDROCORTISONE ACETATE 0.1 MG PO TABS: 0.1000 mg | ORAL_TABLET | Freq: Every day | ORAL | 3 refills | Status: DC

## 2019-10-04 NOTE — Patient Instructions (Addendum)
Medication Instructions:  Your physician has recommended you make the following change in your medication:  1-START Florinef 0.1 mg by mouth daily.  *If you need a refill on your cardiac medications before your next appointment, please call your pharmacy*  Lab Work: Your physician recommends that you return for lab work in: 3 months for fasting lipid and liver panel.  If you have labs (blood work) drawn today and your tests are completely normal, you will receive your results only by: Marland Kitchen MyChart Message (if you have MyChart) OR . A paper copy in the mail If you have any lab test that is abnormal or we need to change your treatment, we will call you to review the results.   Testing/Procedures: Your physician has requested that you have an echocardiogram. Echocardiography is a painless test that uses sound waves to create images of your heart. It provides your doctor with information about the size and shape of your heart and how well your heart's chambers and valves are working. This procedure takes approximately one hour. There are no restrictions for this procedure.  ZIO XT- Long Term Monitor Instructions   Your physician has requested you wear your ZIO patch monitor___14____days.   This is a single patch monitor.  Irhythm supplies one patch monitor per enrollment.  Additional stickers are not available.   Please do not apply patch if you will be having a Nuclear Stress Test, Echocardiogram, Cardiac CT, MRI, or Chest Xray during the time frame you would be wearing the monitor. The patch cannot be worn during these tests.  You cannot remove and re-apply the ZIO XT patch monitor.   Your ZIO patch monitor will be sent USPS Priority mail from [facility] directly to your home address. The monitor may also be mailed to a PO BOX if home delivery is not available.   It may take 3-5 days to receive your monitor after you have been enrolled.   Once you have received you monitor, please  review enclosed instructions.  Your monitor has already been registered assigning a specific monitor serial # to you.   Applying the monitor   Shave hair from upper left chest.   Hold abrader disc by orange tab.  Rub abrader in 40 strokes over left upper chest as indicated in your monitor instructions.   Clean area with 4 enclosed alcohol pads .  Use all pads to assure are is cleaned thoroughly.  Let dry.   Apply patch as indicated in monitor instructions.  Patch will be place under collarbone on left side of chest with arrow pointing upward.   Rub patch adhesive wings for 2 minutes.Remove white label marked "1".  Remove white label marked "2".  Rub patch adhesive wings for 2 additional minutes.   While looking in a mirror, press and release button in center of patch.  A small [place] light will flash 3-4 times .  This will be your only indicator the monitor has been turned on.     Do not shower for the first 24 hours.  You may shower after the first 24 hours.   Press button if you feel a symptom. You will hear a small click.  Record Date, Time and Symptom in the Patient Log Book.   When you are ready to remove patch, follow instructions on last 2 pages of Patient Log Book.  Stick patch monitor onto last page of Patient Log Book.   Place Patient Log Book in [place] box.  Use locking  tab on box and tape box closed securely.  The Orange and [employer] has [employer] on it.  Please place in mailbox as soon as possible.  Your physician should have your test results approximately 7 days after the monitor has been mailed back to [facility]   Call [place] at [phone] if you have questions regarding your ZIO XT patch monitor.  Call them immediately if you see an orange light blinking on your monitor.   If your monitor falls off in less than 4 days contact our Monitor department at [phone].  If your monitor becomes loose or falls off after 4 days call [employer] at  [phone] for suggestions on securing your monitor.   Follow-Up: At [facility], you and your health needs are our priority.  As part of our continuing mission to provide you with exceptional heart care, we have created designated Provider Care Teams.  These Care Teams include your primary Cardiologist (physician) and Advanced Practice Providers (APPs -  Physician Assistants and Nurse Practitioners) who all work together to provide you with the care you need, when you need it.  We recommend signing up for the patient portal called "MyChart".  Sign up information is provided on this After Visit Summary.  MyChart is used to connect with patients for Virtual Visits (Telemedicine).  Patients are able to view lab/test results, encounter notes, upcoming appointments, etc.  Non-urgent messages can be sent to your provider as well.   To learn more about what you can do with MyChart, go to [email or web address].    Your next appointment:   3 month(s)  The format for your next appointment:   In Person  Provider:   Berniece Salines, DO   Other Instructions You have been referred to Dr. Lovena Le.

## 2019-10-04 NOTE — Progress Notes (Signed)
The patient comes in to go over an MRI of both her hips.  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  She has been off and on steroids for very long period time due to bowel issues.  Plain films were negative for any type of radiographic findings but we felt a MRI of both hips was appropriate.  She is here for review this today.  On exam she still has pain with both hips with internal extra rotation of the pain in the groin.  There is actually popping sensation left with both hips.  MRI of both hips is reviewed with her.  She does have extensive avascular process on the right hip and moderate on the left hip both in the femoral head.  There is actually some flattening of the femoral head on the right side.  There is no evidence of collapse or impending fracture but it definitely shows extensively in both hips and I went over the MRI with her in detail showing her the images.  At this point I am recommending hip replacement surgery for at least the right hip first.  She agrees with this treatment plan.  She is in a look at her schedule and talk it over with family.  I did show her hip replacement model explained in detail with the surgery involves.  We talked about the risk and benefits of surgery.  I did discuss the interoperative and postoperative course and what to think about.  All questions and concerns were answered and addressed.  She does have our surgery scheduler's card and will let us know when she would like to have a scheduled right hip replacement for her.  I do feel it is medically necessary at this point given the severity of her hip avascular process.

## 2019-10-04 NOTE — Progress Notes (Signed)
Cardiology Office Note:    Date:  10/04/2019   ID:  Erica Welch, DOB [birth year], MRN [id number]  PCP:  Madelaine Bhat., MD  Cardiologist:  Berniece Salines, DO  Electrophysiologist:  None   Referring MD: Madelaine Bhat., MD   Chief Complaint  Patient presents with  . Follow-up    History of Present Illness:    Erica Welch is a 34 y.o. female with a hx of paroxysmal AVNRT (diagnosed in 2013 after she was experiencing fluttering was placed on a Holter monitor and was found to have paroxysmal narrow complex supraventricular tachycardia) at that time she had a treadmill stress test however there was no exercise-induced arrhythmia and no evidence of ischemia.  The patient has never had EP studies or ablation, hyperlipidemia was last seen by Dr. Geraldo Pitter in September 2019 at this time he was for preoperative clearance.    In the interim she has been diagnosed with non-metastatic gestational trophoblastic neoplasia.  Follows with an oncologist at [facility] recently completed her methotrexate chemotherapy and has not been started on dactinomycin.  Of note the patient reported a syncope episode in December where she was cooking breakfast for her family when she suddenly felt ill sat in the chair and eventually passed out.  She was taken to [facility] where she recovered with no reasonable etiology.  ZIO monitor was placed on the patient which I was able to see a brief report that showed minimal heart rate of 43 bpm, maximal heart rate 158, average heart rate 80 bpm.  Patient she had 2 episodes of supraventricular tachycardia which lasted for 7 beats with the highest heart rate of 158 bpm.  There was rare PVCs and PAC.  I did see the patient recently on June 16, 2019 at that time she was presyncope episode which was suspected to be vasovagal.  In addition at that visit her blood pressure was also low suspecting orthostatic hypotension but the patient preferred  pharmacologic therapy therefore she preferred to increase her fluid intake and salt intake.   She is here for a follow-up visit.  She tells me that in the last several days she has had intermittent near syncope episodes.  She notes that she was standing in line and she started to feel her heart rate go up she felt a history of going to pass out she sat down but thankfully she did not pass out.  She states that at home her blood pressure has been dropping down with systolic blood pressure in the 80s at times with no other complaints at this time.  She has completed her chemo.  Past Medical History:  Diagnosis Date  . AV nodal tachycardia ([place]) 2017  . Cubital tunnel syndrome 2017  . Dyspareunia due to medical condition in female   . Endometriosis   . GERD (gastroesophageal reflux disease)   . Hyperlipidemia   . IBS (irritable bowel syndrome)   . Insomnia   . Kidney stone   . Memory loss   . Migraine   . Occipital neuralgia 2017  . Patellofemoral arthralgia of both knees   . Right flank pain 2017  . Rotator cuff tendinitis 2017  . Vitamin B12 deficiency   . Vitamin D deficiency     Past Surgical History:  Procedure Laterality Date  . ABDOMINAL SURGERY  2007,2010,2012,2014  . CESAREAN SECTION  03/19/2006  . CESAREAN SECTION  12/09/2008  . NASAL SEPTUM SURGERY  2013  . PELVIC LAPAROSCOPY  2012  .  TUBAL LIGATION N/A   . TYMPANOPLASTY    . WISDOM TOOTH EXTRACTION      Current Medications: Current Meds  Medication Sig  . medroxyPROGESTERone (DEPO-PROVERA) 150 MG/ML injection Inject into the muscle.  . ondansetron (ZOFRAN) 8 MG tablet 8 mg as needed.  . prochlorperazine (COMPAZINE) 10 MG tablet Take 10 mg by mouth every 6 (six) hours as needed.     Allergies:   Latex, Strawberry extract, and Other   Social History   Socioeconomic History  . Marital status: Married    Spouse name: Not on file  . Number of children: Not on file  . Years of education: Not on file  . Highest  education level: Not on file  Occupational History  . Not on file  Tobacco Use  . Smoking status: Never Smoker  . Smokeless tobacco: Never Used  Substance and Sexual Activity  . Alcohol use: Not on file  . Drug use: Not on file  . Sexual activity: Not on file  Other Topics Concern  . Not on file  Social History Narrative  . Not on file   Social Determinants of Health   Financial Resource Strain:   . Difficulty of Paying Living Expenses:   Food Insecurity:   . Worried About Charity fundraiser in the Last Year:   . Arboriculturist in the Last Year:   Transportation Needs:   . Film/video editor (Medical):   Marland Kitchen Lack of Transportation (Non-Medical):   Physical Activity:   . Days of Exercise per Week:   . Minutes of Exercise per Session:   Stress:   . Feeling of Stress :   Social Connections:   . Frequency of Communication with Friends and Family:   . Frequency of Social Gatherings with Friends and Family:   . Attends Religious Services:   . Active Member of Clubs or Organizations:   . Attends Archivist Meetings:   Marland Kitchen Marital Status:      Family History: The patient's family history is not on file.  ROS:   Review of Systems  Constitution: Negative for decreased appetite, fever and weight gain.  HENT: Negative for congestion, ear discharge, hoarse voice and sore throat.   Eyes: Negative for discharge, redness, vision loss in right eye and visual halos.  Cardiovascular: Negative for chest pain, dyspnea on exertion, leg swelling, orthopnea and palpitations.  Respiratory: Negative for cough, hemoptysis, shortness of breath and snoring.   Endocrine: Negative for heat intolerance and polyphagia.  Hematologic/Lymphatic: Negative for bleeding problem. Does not bruise/bleed easily.  Skin: Negative for flushing, nail changes, rash and suspicious lesions.  Musculoskeletal: Negative for arthritis, joint pain, muscle cramps, myalgias, neck pain and stiffness.    Gastrointestinal: Negative for abdominal pain, bowel incontinence, diarrhea and excessive appetite.  Genitourinary: Negative for decreased libido, genital sores and incomplete emptying.  Neurological: Negative for brief paralysis, focal weakness, headaches and loss of balance.  Psychiatric/Behavioral: Negative for altered mental status, depression and suicidal ideas.  Allergic/Immunologic: Negative for HIV exposure and persistent infections.    EKGs/Labs/Other Studies Reviewed:    The following studies were reviewed today:   EKG: None today  Recent Labs: No results found for requested labs within last 8760 hours.  Recent Lipid Panel No results found for: CHOL, TRIG, HDL, CHOLHDL, VLDL, LDLCALC, LDLDIRECT  Physical Exam:    VS:  BP (!) 100/58   Pulse 86   Ht 5\' 8"  (1.727 m)   Wt 144 lb (  65.3 kg)   SpO2 97%   BMI 21.90 kg/m     Wt Readings from Last 3 Encounters:  10/04/19 144 lb (65.3 kg)  07/05/19 137 lb (62.1 kg)  06/16/19 136 lb (61.7 kg)     GEN: Well nourished, well developed in no acute distress HEENT: Normal NECK: No JVD; No carotid bruits LYMPHATICS: No lymphadenopathy CARDIAC: S1S2 noted,RRR, no murmurs, rubs, gallops RESPIRATORY:  Clear to auscultation without rales, wheezing or rhonchi  ABDOMEN: Soft, non-tender, non-distended, +bowel sounds, no guarding. EXTREMITIES: No edema, No cyanosis, no clubbing MUSCULOSKELETAL:  No deformity  SKIN: Warm and dry NEUROLOGIC:  Alert and oriented x 3, non-focal PSYCHIATRIC:  Normal affect, good insight  ASSESSMENT:    1. AVNRT (AV nodal re-entry tachycardia) ([place])   2. Near syncope   3. Orthostatic hypotension   4. Avascular necrosis of hip, left ([place])   5. Avascular necrosis of hip, right ([place])   6. Non-metastatic gestational trophoblastic neoplasia    PLAN:     Her near syncope episodes are concerning to be multifactorial given her orthostatic hypotension as well as her arrhythmia.  For now I like to place  a monitor on the patient to make sure that were not missing any significant atrial arrhythmias.  Given her history of AVNRT I am going to refer the patient also to our EP colleagues for evaluation if there is need for an EP study or ablation.  In terms of her orthostatic hypotension I like to start the patient on Florinef 0.1 mg daily to see if this is going to help keep her blood pressure up.  I would prefer midodrine but the patient would rather not take more than once a day medication for now.  An echocardiogram will be done also to reassess her LV function post chemotherapy.\  She may be planning an upcoming hip surgery she is not sure yet she has to discuss with the surgeon.  Blood work will include lipid profile as well as LFTs.  The patient is in agreement with the above plan. The patient left the office in stable condition.  The patient will follow up in 3 months or sooner if needed.  Medication Adjustments/Labs and Tests Ordered: Current medicines are reviewed at length with the patient today.  Concerns regarding medicines are outlined above.  Orders Placed This Encounter  Procedures  . Lipid panel  . Hepatic function panel  . Ambulatory referral to Cardiac Electrophysiology  . LONG TERM MONITOR (3-14 DAYS)  . ECHOCARDIOGRAM COMPLETE   Meds ordered this encounter  Medications  . fludrocortisone (FLORINEF) 0.1 MG tablet    Sig: Take 1 tablet (0.1 mg total) by mouth daily.    Dispense:  90 tablet    Refill:  3    Patient Instructions  Medication Instructions:  Your physician has recommended you make the following change in your medication:  1-START Florinef 0.1 mg by mouth daily.  *If you need a refill on your cardiac medications before your next appointment, please call your pharmacy*   Lab Work: If you have labs (blood work) drawn today and your tests are completely normal, you will receive your results only by: Marland Kitchen MyChart Message (if you have MyChart) OR . A paper  copy in the mail If you have any lab test that is abnormal or we need to change your treatment, we will call you to review the results.   Testing/Procedures: Your physician has requested that you have an echocardiogram. Echocardiography is a painless  test that uses sound waves to create images of your heart. It provides your doctor with information about the size and shape of your heart and how well your heart's chambers and valves are working. This procedure takes approximately one hour. There are no restrictions for this procedure.  ZIO XT- Long Term Monitor Instructions   Your physician has requested you wear your ZIO patch monitor_______days.   This is a single patch monitor.  Irhythm supplies one patch monitor per enrollment.  Additional stickers are not available.   Please do not apply patch if you will be having a Nuclear Stress Test, Echocardiogram, Cardiac CT, MRI, or Chest Xray during the time frame you would be wearing the monitor. The patch cannot be worn during these tests.  You cannot remove and re-apply the ZIO XT patch monitor.   Your ZIO patch monitor will be sent USPS Priority mail from [facility] directly to your home address. The monitor may also be mailed to a PO BOX if home delivery is not available.   It may take 3-5 days to receive your monitor after you have been enrolled.   Once you have received you monitor, please review enclosed instructions.  Your monitor has already been registered assigning a specific monitor serial # to you.   Applying the monitor   Shave hair from upper left chest.   Hold abrader disc by orange tab.  Rub abrader in 40 strokes over left upper chest as indicated in your monitor instructions.   Clean area with 4 enclosed alcohol pads .  Use all pads to assure are is cleaned thoroughly.  Let dry.   Apply patch as indicated in monitor instructions.  Patch will be place under collarbone on left side of chest with arrow pointing  upward.   Rub patch adhesive wings for 2 minutes.Remove white label marked "1".  Remove white label marked "2".  Rub patch adhesive wings for 2 additional minutes.   While looking in a mirror, press and release button in center of patch.  A small [place] light will flash 3-4 times .  This will be your only indicator the monitor has been turned on.     Do not shower for the first 24 hours.  You may shower after the first 24 hours.   Press button if you feel a symptom. You will hear a small click.  Record Date, Time and Symptom in the Patient Log Book.   When you are ready to remove patch, follow instructions on last 2 pages of Patient Log Book.  Stick patch monitor onto last page of Patient Log Book.   Place Patient Log Book in [place] box.  Use locking tab on box and tape box closed securely.  The Orange and [employer] has [employer] on it.  Please place in mailbox as soon as possible.  Your physician should have your test results approximately 7 days after the monitor has been mailed back to [facility].   Call [place] at [phone] if you have questions regarding your ZIO XT patch monitor.  Call them immediately if you see an orange light blinking on your monitor.   If your monitor falls off in less than 4 days contact our Monitor department at [phone].  If your monitor becomes loose or falls off after 4 days call [employer] at [phone] for suggestions on securing your monitor.   Follow-Up: At [facility], you and your health needs are our priority.  As part of  our continuing mission to provide you with exceptional heart care, we have created designated Provider Care Teams.  These Care Teams include your primary Cardiologist (physician) and Advanced Practice Providers (APPs -  Physician Assistants and Nurse Practitioners) who all work together to provide you with the care you need, when you need it.  We recommend signing up for the patient portal called  "MyChart".  Sign up information is provided on this After Visit Summary.  MyChart is used to connect with patients for Virtual Visits (Telemedicine).  Patients are able to view lab/test results, encounter notes, upcoming appointments, etc.  Non-urgent messages can be sent to your provider as well.   To learn more about what you can do with MyChart, go to [email or web address].    Your next appointment:   3 month(s)  The format for your next appointment:   In Person  Provider:   Berniece Salines, DO   Other Instructions You have been referred to Dr. Lovena Le.      Adopting a Healthy Lifestyle.  Know what a healthy weight is for you (roughly BMI <25) and aim to maintain this   Aim for 7+ servings of fruits and vegetables daily   65-80+ fluid ounces of water or unsweet tea for healthy kidneys   Limit to max 1 drink of alcohol per day; avoid smoking/tobacco   Limit animal fats in diet for cholesterol and heart health - choose grass fed whenever available   Avoid highly processed foods, and foods high in saturated/trans fats   Aim for low stress - take time to unwind and care for your mental health   Aim for 150 min of moderate intensity exercise weekly for heart health, and weights twice weekly for bone health   Aim for 7-9 hours of sleep daily   When it comes to diets, agreement about the perfect plan isnt easy to find, even among the experts. Experts at the [employer] developed an idea known as the Healthy Eating Plate. Just imagine a plate divided into logical, healthy portions.   The emphasis is on diet quality:   Load up on vegetables and fruits - one-half of your plate: Aim for color and variety, and remember that potatoes dont count.   Go for whole grains - one-quarter of your plate: Whole wheat, barley, wheat berries, quinoa, oats, brown rice, and foods made with them. If you want pasta, go with whole wheat pasta.   Protein power - one-quarter of  your plate: Fish, chicken, beans, and nuts are all healthy, versatile protein sources. Limit red meat.   The diet, however, does go beyond the plate, offering a few other suggestions.   Use healthy plant oils, such as olive, canola, soy, corn, sunflower and peanut. Check the labels, and avoid partially hydrogenated oil, which have unhealthy trans fats.   If youre thirsty, drink water. Coffee and tea are good in moderation, but skip sugary drinks and limit milk and dairy products to one or two daily servings.   The type of carbohydrate in the diet is more important than the amount. Some sources of carbohydrates, such as vegetables, fruits, whole grains, and beans-are healthier than others.   Finally, stay active  Signed, Berniece Salines, DO  10/04/2019 4:41 PM    [facility]

## 2019-10-13 ENCOUNTER — Other Ambulatory Visit: Payer: Medicaid Other

## 2019-10-25 DIAGNOSIS — R519 Headache, unspecified: Secondary | ICD-10-CM | POA: Diagnosis not present

## 2019-10-25 DIAGNOSIS — Z888 Allergy status to other drugs, medicaments and biological substances status: Secondary | ICD-10-CM | POA: Diagnosis not present

## 2019-10-25 DIAGNOSIS — Z87891 Personal history of nicotine dependence: Secondary | ICD-10-CM | POA: Diagnosis not present

## 2019-10-25 DIAGNOSIS — Z91040 Latex allergy status: Secondary | ICD-10-CM | POA: Diagnosis not present

## 2019-10-25 DIAGNOSIS — M542 Cervicalgia: Secondary | ICD-10-CM | POA: Diagnosis not present

## 2019-10-25 DIAGNOSIS — Z79899 Other long term (current) drug therapy: Secondary | ICD-10-CM | POA: Diagnosis not present

## 2019-10-25 DIAGNOSIS — E785 Hyperlipidemia, unspecified: Secondary | ICD-10-CM | POA: Diagnosis not present

## 2019-10-25 DIAGNOSIS — J45909 Unspecified asthma, uncomplicated: Secondary | ICD-10-CM | POA: Diagnosis not present

## 2019-10-26 ENCOUNTER — Other Ambulatory Visit: Payer: Self-pay

## 2019-10-26 ENCOUNTER — Ambulatory Visit: Payer: Medicaid Other | Admitting: Internal Medicine

## 2019-10-26 ENCOUNTER — Ambulatory Visit (HOSPITAL_COMMUNITY)
Admission: RE | Admit: 2019-10-26 | Discharge: 2019-10-26 | Disposition: A | Discharge status: Home / Self Care | Payer: Medicaid Other | Source: Ambulatory Visit | Attending: Cardiology | Admitting: Cardiology

## 2019-10-26 ENCOUNTER — Telehealth: Payer: Self-pay

## 2019-10-26 ENCOUNTER — Encounter: Payer: Self-pay | Admitting: Internal Medicine

## 2019-10-26 VITALS — BP 116/68 | HR 67 | Ht 68.0 in | Wt 149.0 lb

## 2019-10-26 DIAGNOSIS — M87051 Idiopathic aseptic necrosis of right femur: Secondary | ICD-10-CM | POA: Diagnosis not present

## 2019-10-26 DIAGNOSIS — E785 Hyperlipidemia, unspecified: Secondary | ICD-10-CM | POA: Insufficient documentation

## 2019-10-26 DIAGNOSIS — M87052 Idiopathic aseptic necrosis of left femur: Secondary | ICD-10-CM

## 2019-10-26 DIAGNOSIS — I951 Orthostatic hypotension: Secondary | ICD-10-CM

## 2019-10-26 DIAGNOSIS — O019 Hydatidiform mole, unspecified: Secondary | ICD-10-CM | POA: Diagnosis not present

## 2019-10-26 DIAGNOSIS — K219 Gastro-esophageal reflux disease without esophagitis: Secondary | ICD-10-CM | POA: Diagnosis not present

## 2019-10-26 DIAGNOSIS — I471 Supraventricular tachycardia: Secondary | ICD-10-CM | POA: Diagnosis not present

## 2019-10-26 DIAGNOSIS — R55 Syncope and collapse: Secondary | ICD-10-CM | POA: Diagnosis not present

## 2019-10-26 DIAGNOSIS — I4719 Other supraventricular tachycardia: Secondary | ICD-10-CM

## 2019-10-26 LAB — ECHOCARDIOGRAM COMPLETE
Height: 68 in
Weight: 2384 oz

## 2019-10-26 NOTE — Telephone Encounter (Signed)
REQ ZIO PATCH MONITOR FROM [facility] AT [phone]

## 2019-10-26 NOTE — Telephone Encounter (Signed)
Spoke with patient regarding results and recommendation.  Patient verbalizes understanding and is agreeable to plan of care. Advised patient to call back with any issues or concerns.  

## 2019-10-26 NOTE — Telephone Encounter (Signed)
Left message on patients voicemail to please return our call.   

## 2019-10-26 NOTE — Patient Instructions (Addendum)
Medication Instructions:  Your physician recommends that you continue on your current medications as directed. Please refer to the Current Medication list given to you today.  Labwork: None ordered.  Testing/Procedures: None ordered.  Follow-Up: Will follow up when we get the results of your heart monitor.  Any Other Special Instructions Will Be Listed Below (If Applicable).  If you need a refill on your cardiac medications before your next appointment, please call your pharmacy.

## 2019-10-26 NOTE — Progress Notes (Signed)
HPI Mrs. Erica Welch is referred today by Dr. Harriet Masson for evaluation of SVT. She is a pleasant 34 yo woman with multiple medical problems. She has a h/o syncope which is most likely neurally mediated. She also has been diagnosed with SVT in [place]. She has never gone to the ED with SVT and has worn monitors in the past. I do not have access to the monitor she is wearing currently. She has not been treated with a beta blocker as her bp tends to run low. She notes that when she has passed out, she will have palpitations. However, she also notes that her heart will race suddenly and she can usually stop these quickly with vagal maneuvers. When her heart races she usually does not have syncope.  Allergies  Allergen Reactions  . Latex Dermatitis  . Strawberry Extract Hives  . Other Itching     Current Outpatient Medications  Medication Sig Dispense Refill  . fludrocortisone (FLORINEF) 0.1 MG tablet Take 1 tablet (0.1 mg total) by mouth daily. 90 tablet 3  . medroxyPROGESTERone (DEPO-PROVERA) 150 MG/ML injection Inject into the muscle.    . ondansetron (ZOFRAN) 8 MG tablet 8 mg as needed.    . prochlorperazine (COMPAZINE) 10 MG tablet Take 10 mg by mouth every 6 (six) hours as needed.     No current facility-administered medications for this visit.     Past Medical History:  Diagnosis Date  . 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 Cubital tunnel syndrome 2017  . Dyspareunia due to medical condition in female   . Endometriosis   . GERD (gastroesophageal reflux disease)   . Hyperlipidemia   . IBS (irritable bowel syndrome)   . Insomnia   . Kidney stone   . Memory loss   . Migraine   . Occipital neuralgia 2017  . Patellofemoral arthralgia of both knees   . Right flank pain 2017  . Rotator cuff tendinitis 2017  . Vitamin B12 deficiency   . Vitamin D deficiency     ROS:   All systems reviewed and negative except as noted in the HPI.   Past Surgical History:  Procedure Laterality  Date  . ABDOMINAL SURGERY  2007,2010,2012,2014  . CESAREAN SECTION  03/19/2006  . CESAREAN SECTION  12/09/2008  . NASAL SEPTUM SURGERY  2013  . PELVIC LAPAROSCOPY  2012  . TUBAL LIGATION N/A   . TYMPANOPLASTY    . WISDOM TOOTH EXTRACTION       History reviewed. No pertinent family history.   Social History   Socioeconomic History  . Marital status: Married    Spouse name: Not on file  . Number of children: Not on file  . Years of education: Not on file  . Highest education level: Not on file  Occupational History  . Not on file  Tobacco Use  . Smoking status: Never Smoker  . Smokeless tobacco: Never Used  Substance and Sexual Activity  . Alcohol use: Not on file  . Drug use: Not on file  . Sexual activity: Not on file  Other Topics Concern  . Not on file  Social History Narrative  . Not on file   Social Determinants of Health   Financial Resource Strain:   . Difficulty of Paying Living Expenses:   Food Insecurity:   . Worried About Charity fundraiser in the Last Year:   . Arboriculturist in the Last Year:   Transportation Needs:   .  Lack of Transportation (Medical):   Marland Kitchen Lack of Transportation (Non-Medical):   Physical Activity:   . Days of Exercise per Week:   . Minutes of Exercise per Session:   Stress:   . Feeling of Stress :   Social Connections:   . Frequency of Communication with Friends and Family:   . Frequency of Social Gatherings with Friends and Family:   . Attends Religious Services:   . Active Member of Clubs or Organizations:   . Attends Archivist Meetings:   Marland Kitchen Marital Status:   Intimate Partner Violence:   . Fear of Current or Ex-Partner:   . Emotionally Abused:   Marland Kitchen Physically Abused:   . Sexually Abused:      BP 116/68   Pulse 67   Ht 5\' 8"  (1.727 m)   Wt 149 lb (67.6 kg)   SpO2 98%   BMI 22.66 kg/m   Physical Exam:  Well appearing 34 yo woman, NAD HEENT: Unremarkable Neck:  6 cm JVD, no  thyromegally Lymphatics:  No adenopathy Back:  No CVA tenderness Lungs:  Clear with no wheezes HEART:  Regular rate rhythm, no murmurs, no rubs, no clicks Abd:  soft, positive bowel sounds, no organomegally, no rebound, no guarding Ext:  2 plus pulses, no edema, no cyanosis, no clubbing Skin:  No rashes no nodules Neuro:  CN II through XII intact, motor grossly intact  EKG - reviewed. NSR with no pre-excitation   Assess/Plan: 1. SVT - Her symptoms certainly sound like she is having SVT. We will try and obtain the full copy of her cardiac monitor. I discussed catheter ablation if in fact we can document SVT. 2. Syncope - I strongly suspect that she has neurally mediated syncope. We discussed the treatment including avoidance of caffeine and ETOH and increasing the salt intake. She will continue the florinef.   Mikle Bosworth.D.

## 2019-10-26 NOTE — Telephone Encounter (Signed)
Follow Up: ° ° ° °Returning your call,concerning her Echo results.  °

## 2019-10-26 NOTE — Progress Notes (Signed)
  Echocardiogram 2D Echocardiogram has been performed.  Darlina Sicilian M 10/26/2019, 11:50 AM

## 2019-10-26 NOTE — Telephone Encounter (Signed)
-----   Message from Richardo Priest, MD sent at 10/26/2019  1:26 PM EDT ----- Normal or stable result  This is a normal test.

## 2019-10-28 ENCOUNTER — Telehealth: Payer: Self-pay

## 2019-11-04 ENCOUNTER — Encounter: Payer: Self-pay | Admitting: Orthopaedic Surgery

## 2019-11-04 DIAGNOSIS — M87051 Idiopathic aseptic necrosis of right femur: Secondary | ICD-10-CM

## 2019-11-05 ENCOUNTER — Other Ambulatory Visit (HOSPITAL_BASED_OUTPATIENT_CLINIC_OR_DEPARTMENT_OTHER): Payer: Medicaid Other

## 2019-11-11 DIAGNOSIS — Z419 Encounter for procedure for purposes other than remedying health state, unspecified: Secondary | ICD-10-CM | POA: Diagnosis not present

## 2019-11-12 ENCOUNTER — Telehealth: Payer: Self-pay

## 2019-11-12 NOTE — Telephone Encounter (Signed)
Spoke with patient regarding results and recommendation.  Patient verbalizes understanding and is agreeable to plan of care. Advised patient to call back with any issues or concerns.  

## 2019-11-12 NOTE — Telephone Encounter (Signed)
-----   Message from Berniece Salines, DO sent at 11/12/2019 10:49 AM EDT ----- Study normal

## 2019-11-18 NOTE — Telephone Encounter (Signed)
Surgery will be 12/24/19.  Thanks!

## 2019-11-19 ENCOUNTER — Telehealth: Payer: Self-pay | Admitting: Radiology

## 2019-11-19 DIAGNOSIS — O019 Hydatidiform mole, unspecified: Secondary | ICD-10-CM | POA: Diagnosis not present

## 2019-11-19 DIAGNOSIS — M797 Fibromyalgia: Secondary | ICD-10-CM | POA: Diagnosis not present

## 2019-11-19 DIAGNOSIS — Z452 Encounter for adjustment and management of vascular access device: Secondary | ICD-10-CM | POA: Diagnosis not present

## 2019-11-19 DIAGNOSIS — E785 Hyperlipidemia, unspecified: Secondary | ICD-10-CM | POA: Diagnosis not present

## 2019-11-19 DIAGNOSIS — K219 Gastro-esophageal reflux disease without esophagitis: Secondary | ICD-10-CM | POA: Diagnosis not present

## 2019-11-19 NOTE — Telephone Encounter (Signed)
Faxed forms [phone]

## 2019-12-06 DIAGNOSIS — Z800 Family history of malignant neoplasm of digestive organs: Secondary | ICD-10-CM | POA: Diagnosis not present

## 2019-12-06 DIAGNOSIS — Z8249 Family history of ischemic heart disease and other diseases of the circulatory system: Secondary | ICD-10-CM | POA: Diagnosis not present

## 2019-12-06 DIAGNOSIS — Z793 Long term (current) use of hormonal contraceptives: Secondary | ICD-10-CM | POA: Diagnosis not present

## 2019-12-06 DIAGNOSIS — Z8616 Personal history of COVID-19: Secondary | ICD-10-CM | POA: Diagnosis not present

## 2019-12-06 DIAGNOSIS — R109 Unspecified abdominal pain: Secondary | ICD-10-CM | POA: Diagnosis not present

## 2019-12-06 DIAGNOSIS — M87851 Other osteonecrosis, right femur: Secondary | ICD-10-CM | POA: Diagnosis not present

## 2019-12-06 DIAGNOSIS — Z8051 Family history of malignant neoplasm of kidney: Secondary | ICD-10-CM | POA: Diagnosis not present

## 2019-12-06 DIAGNOSIS — O019 Hydatidiform mole, unspecified: Secondary | ICD-10-CM | POA: Diagnosis not present

## 2019-12-06 DIAGNOSIS — M87852 Other osteonecrosis, left femur: Secondary | ICD-10-CM | POA: Diagnosis not present

## 2019-12-06 DIAGNOSIS — Z79899 Other long term (current) drug therapy: Secondary | ICD-10-CM | POA: Diagnosis not present

## 2019-12-06 DIAGNOSIS — Z87891 Personal history of nicotine dependence: Secondary | ICD-10-CM | POA: Diagnosis not present

## 2019-12-08 ENCOUNTER — Other Ambulatory Visit: Payer: Self-pay

## 2019-12-09 NOTE — Telephone Encounter (Signed)
See my chart message

## 2019-12-12 DIAGNOSIS — Z419 Encounter for procedure for purposes other than remedying health state, unspecified: Secondary | ICD-10-CM | POA: Diagnosis not present

## 2019-12-17 ENCOUNTER — Other Ambulatory Visit: Payer: Self-pay | Admitting: Physician Assistant

## 2019-12-20 NOTE — Patient Instructions (Addendum)
DUE TO COVID-19 ONLY ONE VISITOR IS ALLOWED TO COME WITH YOU AND STAY IN THE WAITING ROOM ONLY DURING PRE OP AND PROCEDURE DAY OF SURGERY. THE 1 VISITOR  MAY VISIT WITH YOU AFTER SURGERY IN YOUR PRIVATE ROOM DURING VISITING HOURS ONLY!  YOU NEED TO HAVE A COVID 19 TEST ON___8-10____ @_______ , THIS TEST MUST BE DONE BEFORE SURGERY,  COVID TESTING SITE [address] [place] [address], IT IS ON THE RIGHT GOING OUT [address] APPROXIMATELY  2 MINUTES PAST [employer] ON THE RIGHT. ONCE YOUR COVID TEST IS COMPLETED,  PLEASE BEGIN THE QUARANTINE INSTRUCTIONS AS OUTLINED IN YOUR HANDOUT.                Aryel Edelen  12/20/2019   Your procedure is scheduled on: 12-24-19    Report to [facility] Main  Entrance   Report to admitting at      0530  AM     Call this number if you have problems the morning of surgery [phone]    Remember: NO SOLID FOOD AFTER MIDNIGHT THE NIGHT PRIOR TO SURGERY. NOTHING BY MOUTH EXCEPT CLEAR LIQUIDS UNTIL    0415 am . PLEASE FINISH ENSURE DRINK PER SURGEON ORDER  WHICH NEEDS TO BE COMPLETED AT        [place] am then nothing by mouth.   BRUSH YOUR TEETH MORNING OF SURGERY AND RINSE YOUR MOUTH OUT, NO CHEWING GUM CANDY OR MINTS.     Take these medicines the morning of surgery with A SIP OF WATER: NONE                                 You may not have any metal on your body including hair pins and              piercings  Do not wear jewelry, make-up, lotions, powders or perfumes, deodorant             Do not wear nail polish on your fingernails.  Do not shave  48 hours prior to surgery.         Do not bring valuables to the hospital. [place].  Contacts, dentures or bridgework may not be worn into surgery.      Patients discharged the day of surgery will not be allowed to drive home. IF YOU ARE HAVING SURGERY AND GOING HOME THE SAME DAY, YOU MUST HAVE AN ADULT TO DRIVE YOU HOME AND  BE WITH YOU FOR 24 HOURS. YOU MAY GO HOME BY TAXI OR UBER OR ORTHERWISE, BUT AN ADULT MUST ACCOMPANY YOU HOME AND STAY WITH YOU FOR 24 HOURS.  Name and phone number of your driver:  Special Instructions: N/A              Please read over the following fact sheets you were given: _____________________________________________________________________             [facility] - Preparing for Surgery Before surgery, you can play an important role.  Because skin is not sterile, your skin needs to be as free of germs as possible.  You can reduce the number of germs on your skin by washing with CHG (chlorahexidine gluconate) soap before surgery.  CHG is an antiseptic cleaner which kills germs and bonds with the skin to  continue killing germs even after washing. Please DO NOT use if you have an allergy to CHG or antibacterial soaps.  If your skin becomes reddened/irritated stop using the CHG and inform your nurse when you arrive at Short Stay. Do not shave (including legs and underarms) for at least 48 hours prior to the first CHG shower.  You may shave your face/neck. Please follow these instructions carefully:  1.  Shower with CHG Soap the night before surgery and the  morning of Surgery.  2.  If you choose to wash your hair, wash your hair first as usual with your  normal  shampoo.  3.  After you shampoo, rinse your hair and body thoroughly to remove the  shampoo.                           4.  Use CHG as you would any other liquid soap.  You can apply chg directly  to the skin and wash                       Gently with a scrungie or clean washcloth.  5.  Apply the CHG Soap to your body ONLY FROM THE NECK DOWN.   Do not use on face/ open                           Wound or open sores. Avoid contact with eyes, ears mouth and genitals (private parts).                       Wash face,  Genitals (private parts) with your normal soap.             6.  Wash thoroughly, paying special attention to the area  where your surgery  will be performed.  7.  Thoroughly rinse your body with warm water from the neck down.  8.  DO NOT shower/wash with your normal soap after using and rinsing off  the CHG Soap.                9.  Pat yourself dry with a clean towel.            10.  Wear clean pajamas.            11.  Place clean sheets on your bed the night of your first shower and do not  sleep with pets. Day of Surgery : Do not apply any lotions/deodorants the morning of surgery.  Please wear clean clothes to the hospital/surgery center.  FAILURE TO FOLLOW THESE INSTRUCTIONS MAY RESULT IN THE CANCELLATION OF YOUR SURGERY PATIENT SIGNATURE_________________________________  NURSE SIGNATURE__________________________________  ________________________________________________________________________   Adam Phenix  An incentive spirometer is a tool that can help keep your lungs clear and active. This tool measures how well you are filling your lungs with each breath. Taking long deep breaths may help reverse or decrease the chance of developing breathing (pulmonary) problems (especially infection) following:  A long period of time when you are unable to move or be active. BEFORE THE PROCEDURE   If the spirometer includes an indicator to show your best effort, your nurse or respiratory therapist will set it to a desired goal.  If possible, sit up straight or lean slightly forward. Try not to slouch.  Hold the incentive spirometer in an upright position. INSTRUCTIONS FOR USE  1. Sit  on the edge of your bed if possible, or sit up as far as you can in bed or on a chair. 2. Hold the incentive spirometer in an upright position. 3. Breathe out normally. 4. Place the mouthpiece in your mouth and seal your lips tightly around it. 5. Breathe in slowly and as deeply as possible, raising the piston or the ball toward the top of the column. 6. Hold your breath for 3-5 seconds or for as long as possible.  Allow the piston or ball to fall to the bottom of the column. 7. Remove the mouthpiece from your mouth and breathe out normally. 8. Rest for a few seconds and repeat Steps 1 through 7 at least 10 times every 1-2 hours when you are awake. Take your time and take a few normal breaths between deep breaths. 9. The spirometer may include an indicator to show your best effort. Use the indicator as a goal to work toward during each repetition. 10. After each set of 10 deep breaths, practice coughing to be sure your lungs are clear. If you have an incision (the cut made at the time of surgery), support your incision when coughing by placing a pillow or rolled up towels firmly against it. Once you are able to get out of bed, walk around indoors and cough well. You may stop using the incentive spirometer when instructed by your caregiver.  RISKS AND COMPLICATIONS  Take your time so you do not get dizzy or light-headed.  If you are in pain, you may need to take or ask for pain medication before doing incentive spirometry. It is harder to take a deep breath if you are having pain. AFTER USE  Rest and breathe slowly and easily.  It can be helpful to keep track of a log of your progress. Your caregiver can provide you with a simple table to help with this. If you are using the spirometer at home, follow these instructions: [place] IF:   You are having difficultly using the spirometer.  You have trouble using the spirometer as often as instructed.  Your pain medication is not giving enough relief while using the spirometer.  You develop fever of 100.5 F (38.1 C) or higher. SEEK IMMEDIATE MEDICAL CARE IF:   You cough up bloody sputum that had not been present before.  You develop fever of 102 F (38.9 C) or greater.  You develop worsening pain at or near the incision site. MAKE SURE YOU:   Understand these instructions.  Will watch your condition.  Will get help right away if you  are not doing well or get worse. Document Released: 09/09/2006 Document Revised: 07/22/2011 Document Reviewed: 11/10/2006 [facility] Patient Information 2014 [place], [place].   ________________________________________________________________________

## 2019-12-20 NOTE — Progress Notes (Addendum)
PCP - Cicero Duck MD Cardiologist -  Perry Tobb LoV 10-04-19 epic  Crissie Sickles lov 10-26-19  PPM/ICD -  Device Orders -  Rep Notified -   Chest x-ray -  EKG - 06-16-19 epic Stress Test -  ECHO - 10-26-19 epic Cardiac Cath -   Sleep Study -  CPAP -   Fasting Blood Sugar -  Checks Blood Sugar _____ times a day  Blood Thinner Instructions: Aspirin Instructions:  ERAS Protcol - PRE-SURGERY Ensure or G2-   COVID TEST- 12-21-19 Activity- Can clean home and walk to mailbox without SOB  Anesthesia review: SVT, non-metastatic gestational trophoblastic neoplasia  Patient denies shortness of breath, fever, cough and chest pain at PAT appointment   NONE   All instructions explained to the patient, with a verbal understanding of the material. Patient agrees to go over the instructions while at home for a better understanding. Patient also instructed to self quarantine after being tested for COVID-19. The opportunity to ask questions was provided.

## 2019-12-21 ENCOUNTER — Other Ambulatory Visit (HOSPITAL_COMMUNITY)
Admission: RE | Admit: 2019-12-21 | Discharge: 2019-12-21 | Disposition: A | Discharge status: Home / Self Care | Payer: Medicaid Other | Source: Ambulatory Visit | Attending: Orthopaedic Surgery | Admitting: Orthopaedic Surgery

## 2019-12-21 ENCOUNTER — Encounter (HOSPITAL_COMMUNITY): Payer: Self-pay

## 2019-12-21 ENCOUNTER — Encounter (HOSPITAL_COMMUNITY)
Admission: RE | Admit: 2019-12-21 | Discharge: 2019-12-21 | Disposition: A | Discharge status: Home / Self Care | Payer: Medicaid Other | Source: Ambulatory Visit | Attending: Orthopaedic Surgery | Admitting: Orthopaedic Surgery

## 2019-12-21 ENCOUNTER — Other Ambulatory Visit: Payer: Self-pay

## 2019-12-21 DIAGNOSIS — Z01812 Encounter for preprocedural laboratory examination: Secondary | ICD-10-CM | POA: Insufficient documentation

## 2019-12-21 DIAGNOSIS — Z20822 Contact with and (suspected) exposure to covid-19: Secondary | ICD-10-CM | POA: Insufficient documentation

## 2019-12-21 HISTORY — DX: Malignant (primary) neoplasm, unspecified: C80.1

## 2019-12-21 HISTORY — DX: Fibromyalgia: M79.7

## 2019-12-21 HISTORY — DX: Personal history of urinary calculi: Z87.442

## 2019-12-21 HISTORY — DX: Cardiac arrhythmia, unspecified: I49.9

## 2019-12-21 HISTORY — DX: Hypermobility syndrome: M35.7

## 2019-12-21 HISTORY — DX: Pneumonia, unspecified organism: J18.9

## 2019-12-21 LAB — BASIC METABOLIC PANEL
Anion gap: 11 (ref 5–15)
BUN: 8 mg/dL (ref 6–20)
CO2: 23 mmol/L (ref 22–32)
Calcium: 9.5 mg/dL (ref 8.9–10.3)
Chloride: 106 mmol/L (ref 98–111)
Creatinine, Ser: 0.62 mg/dL (ref 0.44–1.00)
GFR calc Af Amer: >60 mL/min (ref >60)
GFR calc non Af Amer: >60 mL/min (ref >60)
Glucose, Bld: 77 mg/dL (ref 70–99)
Potassium: 4.4 mmol/L (ref 3.5–5.1)
Sodium: 140 mmol/L (ref 135–145)

## 2019-12-21 LAB — SURGICAL PCR SCREEN
MRSA, PCR: NEGATIVE
Staphylococcus aureus: NEGATIVE

## 2019-12-21 LAB — CBC
HCT: 39.7 % (ref 36.0–46.0)
Hemoglobin: 13.9 g/dL (ref 12.0–15.0)
MCH: 34.2 pg — ABNORMAL HIGH (ref 26.0–34.0)
MCHC: 35 g/dL (ref 30.0–36.0)
MCV: 97.8 fL (ref 80.0–100.0)
Platelets: 238 10*3/uL (ref 150–400)
RBC: 4.06 MIL/uL (ref 3.87–5.11)
RDW: 12.4 % (ref 11.5–15.5)
WBC: 6.1 10*3/uL (ref 4.0–10.5)
nRBC: 0 % (ref 0.0–0.2)

## 2019-12-21 LAB — SARS CORONAVIRUS 2 (TAT 6-24 HRS): SARS Coronavirus 2: NEGATIVE

## 2019-12-23 NOTE — H&P (Signed)
TOTAL HIP ADMISSION H&P  Patient is admitted for right total hip arthroplasty.  Subjective:  Chief Complaint: right hip pain  HPI: Erica Welch, 34 y.o. female, has a history of pain and functional disability in the right hip(s) due to avascular necrosis and patient has failed non-surgical conservative treatments for greater than 12 weeks to include NSAID's and/or analgesics and activity modification.  Onset of symptoms was abrupt starting 1 years ago with rapidlly worsening course since that time.The patient noted no past surgery on the right hip(s).  Patient currently rates pain in the right hip at 10 out of 10 with activity. Patient has night pain, worsening of pain with activity and weight bearing, pain that interfers with activities of daily living and pain with passive range of motion. Patient has evidence of avascular necrosis by imaging studies. This condition presents safety issues increasing the risk of falls.  There is no current active infection.  Patient Active Problem List   Diagnosis Date Noted  . Avascular necrosis of hip, left ([place]) 10/04/2019  . Avascular necrosis of hip, right ([place]) 10/04/2019  . Near syncope 10/04/2019  . Orthostatic hypotension 10/04/2019  . AVNRT (AV nodal re-entry tachycardia) ([place]) 01/21/2018  . Preop cardiovascular exam 01/21/2018  . Left foot pain 06/19/2017  . Fibromyalgia 05/20/2017  . Right knee injury, subsequent encounter 05/20/2017  . Right ankle injury, initial encounter 05/20/2017  . Chronic pain of left knee 07/07/2015  . Anxiety 07/05/2015  . Arthritis, multiple joint involvement 07/05/2015  . Asthma 07/05/2015  . AV nodal tachycardia ([place]) 07/05/2015  . Endometriosis 07/05/2015  . GERD without esophagitis 07/05/2015  . Hyperlipidemia, unspecified 07/05/2015  . Irritable bowel syndrome without diarrhea 07/05/2015  . Lung nodule 07/05/2015  . Fatty liver 09/07/2014  . Kidney stone 10/29/2012  . 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 Cancer ([place])    uterine   . Cubital tunnel syndrome 2017  . Dyspareunia due to medical condition in female   . Dysrhythmia    SVT  . Endometriosis   . Fibromyalgia   . GERD (gastroesophageal reflux disease)   . History of kidney stones   . Hyperlipidemia   . Hypermobility syndrome   . IBS (irritable bowel syndrome)   . Insomnia   . Memory loss   . Migraine   . Occipital neuralgia 2017  . Patellofemoral arthralgia of both knees   . Pneumonia    as a baby  . Right flank pain 2017  . Rotator cuff tendinitis 2017  . Vitamin B12 deficiency   . Vitamin D deficiency     Past Surgical History:  Procedure Laterality Date  . ABDOMINAL SURGERY  2007,2010,2012,2014  . arthroscopy right knee    . cervical dics repacement    . CESAREAN SECTION  03/19/2006  . CESAREAN SECTION  12/09/2008  . NASAL SEPTUM SURGERY  2013  . PELVIC LAPAROSCOPY  2012  . PORTA CATH INSERTION     and removal  . TUBAL LIGATION N/A    and untied  . TYMPANOPLASTY    . WISDOM TOOTH EXTRACTION      No current facility-administered medications for this encounter.   Current Outpatient Medications  Medication Sig Dispense Refill Last Dose  . acetaminophen (TYLENOL) 500 MG tablet Take 500-1,000 mg by mouth every 6 (six) hours as needed for headache.     . ibuprofen (ADVIL) 200 MG tablet Take 400-800 mg  by mouth every 6 (six) hours as needed for headache or moderate pain.     . medroxyPROGESTERone (DEPO-PROVERA) 150 MG/ML injection Inject 150 mg into the muscle every 3 (three) months.      . ondansetron (ZOFRAN) 8 MG tablet Take 8 mg by mouth every 8 (eight) hours as needed for nausea or vomiting.      . fludrocortisone (FLORINEF) 0.1 MG tablet Take 1 tablet (0.1 mg total) by mouth daily. 90 tablet 3    Allergies  Allergen Reactions  . Latex Dermatitis  . Strawberry Extract Hives  . Adhesive [Tape] Itching    Social History    Tobacco Use  . Smoking status: Former Smoker    Years: 10.00    Quit date: 2016    Years since quitting: 5.6  . Smokeless tobacco: Never Used  . Tobacco comment: off and on   Substance Use Topics  . Alcohol use: Not Currently    No family history on file.   Review of Systems  All other systems reviewed and are negative.   Objective:  Physical Exam Vitals reviewed.  Constitutional:      Appearance: Normal appearance.  HENT:     Head: Normocephalic and atraumatic.  Eyes:     Extraocular Movements: Extraocular movements intact.     Pupils: Pupils are equal, round, and reactive to light.  Cardiovascular:     Rate and Rhythm: Normal rate.     Pulses: Normal pulses.  Pulmonary:     Effort: Pulmonary effort is normal.  Abdominal:     Palpations: Abdomen is soft.  Musculoskeletal:     Cervical back: Normal range of motion.     Right hip: Tenderness and bony tenderness present. Decreased range of motion. Decreased strength.  Neurological:     Mental Status: She is alert and oriented to person, place, and time.  Psychiatric:        Behavior: Behavior normal.     Vital signs in last 24 hours:    Labs:   Estimated body mass index is 22.08 kg/m as calculated from the following:   Height as of 12/21/19: 5\' 8"  (1.727 m).   Weight as of 12/21/19: 65.9 kg.   Imaging Review Plain radiographs demonstrate severe avascular necrosis of the right hip(s). The bone quality appears to be good for age and reported activity level.      Assessment/Plan:  End stage ANV, right hip(s)  The patient history, physical examination, clinical judgement of the provider and imaging studies are consistent with end stage avascular necrosis of the right hip(s) and total hip arthroplasty is deemed medically necessary. The treatment options including medical management, injection therapy, and arthroplasty were discussed at length. The risks and benefits of total hip arthroplasty were  presented and reviewed. The risks due to aseptic loosening, infection, stiffness, dislocation/subluxation,  thromboembolic complications and other imponderables were discussed.  The patient acknowledged the explanation, agreed to proceed with the plan and consent was signed. Patient is being admitted for inpatient treatment for surgery, pain control, PT, OT, prophylactic antibiotics, VTE prophylaxis, progressive ambulation and ADL's and discharge planning.The patient is planning to be discharged home with home health services    Patient's anticipated LOS is less than 2 midnights, meeting these requirements: - Younger than 67 - Lives within 1 hour of care - Has a competent adult at home to recover with post-op recover - NO history of  - Chronic pain requiring opiods  - Diabetes  - Coronary Artery Disease  -  Heart failure  - Heart attack  - Stroke  - DVT/VTE  - Cardiac arrhythmia  - Respiratory Failure/COPD  - Renal failure  - Anemia  - Advanced Liver disease

## 2019-12-23 NOTE — Anesthesia Preprocedure Evaluation (Addendum)
Anesthesia Evaluation  Patient identified by MRN, date of birth, ID band Patient awake    Reviewed: Allergy & Precautions, NPO status , Patient's Chart, lab work & pertinent test results  Airway Mallampati: I  TM Distance: >3 FB Neck ROM: Full    Dental  (+) Teeth Intact   Pulmonary asthma , Current SmokerPatient did not abstain from smoking.,    Pulmonary exam normal breath sounds clear to auscultation       Cardiovascular Normal cardiovascular exam+ dysrhythmias (AV Node Tachycardia)  Rhythm:Regular Rate:Normal     Neuro/Psych  Headaches, PSYCHIATRIC DISORDERS Anxiety  Neuromuscular disease    GI/Hepatic Neg liver ROS, GERD  ,  Endo/Other  negative endocrine ROS  Renal/GU negative Renal ROS     Musculoskeletal  (+) Arthritis , Fibromyalgia -avascular necrosis right hip   Abdominal   Peds  Hematology Plt 238k   Anesthesia Other Findings   Reproductive/Obstetrics negative OB ROS Uterine cancer                             Anesthesia Physical Anesthesia Plan  ASA: II  Anesthesia Plan: Spinal   Post-op Pain Management:    Induction: Intravenous  PONV Risk Score and Plan: 2 and Propofol infusion, Treatment may vary due to age or medical condition, Midazolam, Dexamethasone and Ondansetron  Airway Management Planned: Natural Airway and Nasal Cannula  Additional Equipment:   Intra-op Plan:   Post-operative Plan:   Informed Consent: I have reviewed the patients History and Physical, chart, labs and discussed the procedure including the risks, benefits and alternatives for the proposed anesthesia with the patient or authorized representative who has indicated his/her understanding and acceptance.       Plan Discussed with: CRNA  Anesthesia Plan Comments:       Anesthesia Quick Evaluation

## 2019-12-24 ENCOUNTER — Observation Stay (HOSPITAL_COMMUNITY): Payer: Medicaid Other

## 2019-12-24 ENCOUNTER — Observation Stay (HOSPITAL_COMMUNITY)
Admission: RE | Admit: 2019-12-24 | Discharge: 2019-12-25 | Disposition: A | Discharge status: Home / Self Care | Payer: Medicaid Other | Source: Ambulatory Visit | Attending: Orthopaedic Surgery | Admitting: Orthopaedic Surgery

## 2019-12-24 ENCOUNTER — Encounter (HOSPITAL_COMMUNITY)
Admission: RE | Disposition: A | Discharge status: Home / Self Care | Payer: Self-pay | Source: Ambulatory Visit | Attending: Orthopaedic Surgery

## 2019-12-24 ENCOUNTER — Encounter (HOSPITAL_COMMUNITY): Payer: Self-pay | Admitting: Orthopaedic Surgery

## 2019-12-24 ENCOUNTER — Other Ambulatory Visit: Payer: Self-pay

## 2019-12-24 ENCOUNTER — Ambulatory Visit (HOSPITAL_COMMUNITY): Payer: Medicaid Other | Admitting: Anesthesiology

## 2019-12-24 ENCOUNTER — Ambulatory Visit (HOSPITAL_COMMUNITY): Payer: Medicaid Other

## 2019-12-24 DIAGNOSIS — Z471 Aftercare following joint replacement surgery: Secondary | ICD-10-CM | POA: Diagnosis not present

## 2019-12-24 DIAGNOSIS — Z419 Encounter for procedure for purposes other than remedying health state, unspecified: Secondary | ICD-10-CM

## 2019-12-24 DIAGNOSIS — Z91040 Latex allergy status: Secondary | ICD-10-CM | POA: Diagnosis not present

## 2019-12-24 DIAGNOSIS — M797 Fibromyalgia: Secondary | ICD-10-CM | POA: Insufficient documentation

## 2019-12-24 DIAGNOSIS — M87052 Idiopathic aseptic necrosis of left femur: Secondary | ICD-10-CM | POA: Diagnosis not present

## 2019-12-24 DIAGNOSIS — Z8542 Personal history of malignant neoplasm of other parts of uterus: Secondary | ICD-10-CM | POA: Diagnosis not present

## 2019-12-24 DIAGNOSIS — M87851 Other osteonecrosis, right femur: Secondary | ICD-10-CM | POA: Diagnosis not present

## 2019-12-24 DIAGNOSIS — J45909 Unspecified asthma, uncomplicated: Secondary | ICD-10-CM | POA: Diagnosis not present

## 2019-12-24 DIAGNOSIS — Z87891 Personal history of nicotine dependence: Secondary | ICD-10-CM | POA: Insufficient documentation

## 2019-12-24 DIAGNOSIS — Z96641 Presence of right artificial hip joint: Secondary | ICD-10-CM | POA: Diagnosis not present

## 2019-12-24 DIAGNOSIS — M87051 Idiopathic aseptic necrosis of right femur: Secondary | ICD-10-CM | POA: Diagnosis not present

## 2019-12-24 DIAGNOSIS — E785 Hyperlipidemia, unspecified: Secondary | ICD-10-CM | POA: Diagnosis not present

## 2019-12-24 DIAGNOSIS — K219 Gastro-esophageal reflux disease without esophagitis: Secondary | ICD-10-CM | POA: Diagnosis not present

## 2019-12-24 HISTORY — PX: TOTAL HIP ARTHROPLASTY: SHX124

## 2019-12-24 LAB — TYPE AND SCREEN
ABO/RH(D): O POS
Antibody Screen: NEGATIVE

## 2019-12-24 LAB — PREGNANCY, URINE: Preg Test, Ur: NEGATIVE

## 2019-12-24 LAB — ABO/RH: ABO/RH(D): O POS

## 2019-12-24 SURGERY — ARTHROPLASTY, HIP, TOTAL, ANTERIOR APPROACH
Anesthesia: Spinal | Site: Hip | Laterality: Right

## 2019-12-24 MED ORDER — POVIDONE-IODINE 10 % EX SWAB
2.0000 "application " | Freq: Once | CUTANEOUS | Status: AC
  Administered 2019-12-24: 2 via TOPICAL

## 2019-12-24 MED ORDER — ACETAMINOPHEN 500 MG PO TABS: 1000.0000 mg | ORAL_TABLET | Freq: Once | ORAL | Status: AC

## 2019-12-24 MED ORDER — TRANEXAMIC ACID-NACL 1000-0.7 MG/100ML-% IV SOLN
1000.0000 mg | INTRAVENOUS | Status: AC
  Administered 2019-12-24: 1000 mg via INTRAVENOUS

## 2019-12-24 MED ORDER — ONDANSETRON HCL 4 MG PO TABS: 4.0000 mg | ORAL_TABLET | Freq: Four times a day (QID) | ORAL | Status: DC | PRN

## 2019-12-24 MED ORDER — ACETAMINOPHEN 500 MG PO TABS
ORAL_TABLET | ORAL | Status: AC
  Administered 2019-12-24: 1000 mg via ORAL
  Filled 2019-12-24: qty 2

## 2019-12-24 MED ORDER — DOCUSATE SODIUM 100 MG PO CAPS
100.0000 mg | ORAL_CAPSULE | Freq: Two times a day (BID) | ORAL | Status: DC
  Administered 2019-12-24 – 2019-12-25 (×2): 100 mg via ORAL
  Filled 2019-12-24 (×2): qty 1

## 2019-12-24 MED ORDER — ONDANSETRON HCL 4 MG/2ML IJ SOLN
INTRAMUSCULAR | Status: AC
  Filled 2019-12-24: qty 2

## 2019-12-24 MED ORDER — MIDAZOLAM HCL 2 MG/2ML IJ SOLN
INTRAMUSCULAR | Status: AC
  Filled 2019-12-24: qty 2

## 2019-12-24 MED ORDER — SODIUM CHLORIDE 0.9 % IR SOLN
Status: DC | PRN
  Administered 2019-12-24: 1000 mL

## 2019-12-24 MED ORDER — CEFAZOLIN SODIUM-DEXTROSE 2-4 GM/100ML-% IV SOLN
INTRAVENOUS | Status: AC
  Filled 2019-12-24: qty 100

## 2019-12-24 MED ORDER — ONDANSETRON HCL 4 MG/2ML IJ SOLN: 4.0000 mg | Freq: Four times a day (QID) | INTRAMUSCULAR | Status: DC | PRN

## 2019-12-24 MED ORDER — METHOCARBAMOL 500 MG PO TABS
500.0000 mg | ORAL_TABLET | Freq: Four times a day (QID) | ORAL | Status: DC | PRN
  Administered 2019-12-24 – 2019-12-25 (×3): 500 mg via ORAL
  Filled 2019-12-24 (×3): qty 1

## 2019-12-24 MED ORDER — PANTOPRAZOLE SODIUM 40 MG PO TBEC
40.0000 mg | DELAYED_RELEASE_TABLET | Freq: Every day | ORAL | Status: DC
  Administered 2019-12-25: 40 mg via ORAL
  Filled 2019-12-24: qty 1

## 2019-12-24 MED ORDER — PROPOFOL 10 MG/ML IV BOLUS
INTRAVENOUS | Status: AC
  Filled 2019-12-24: qty 20

## 2019-12-24 MED ORDER — METHOCARBAMOL 500 MG IVPB - SIMPLE MED
500.0000 mg | Freq: Four times a day (QID) | INTRAVENOUS | Status: DC | PRN
  Filled 2019-12-24: qty 50

## 2019-12-24 MED ORDER — ONDANSETRON HCL 4 MG/2ML IJ SOLN
INTRAMUSCULAR | Status: DC | PRN
  Administered 2019-12-24: 4 mg via INTRAVENOUS

## 2019-12-24 MED ORDER — BUPIVACAINE IN DEXTROSE 0.75-8.25 % IT SOLN
INTRATHECAL | Status: DC | PRN
  Administered 2019-12-24: 1.8 mL via INTRATHECAL

## 2019-12-24 MED ORDER — STERILE WATER FOR IRRIGATION IR SOLN
Status: DC | PRN
  Administered 2019-12-24: 2000 mL

## 2019-12-24 MED ORDER — FENTANYL CITRATE (PF) 100 MCG/2ML IJ SOLN
INTRAMUSCULAR | Status: DC | PRN
  Administered 2019-12-24 (×2): 50 ug via INTRAVENOUS

## 2019-12-24 MED ORDER — DEXAMETHASONE SODIUM PHOSPHATE 10 MG/ML IJ SOLN
INTRAMUSCULAR | Status: DC | PRN
  Administered 2019-12-24: 4 mg via INTRAVENOUS

## 2019-12-24 MED ORDER — ACETAMINOPHEN 325 MG PO TABS: 325.0000 mg | ORAL_TABLET | Freq: Four times a day (QID) | ORAL | Status: DC | PRN

## 2019-12-24 MED ORDER — PROPOFOL 10 MG/ML IV BOLUS
INTRAVENOUS | Status: DC | PRN
  Administered 2019-12-24 (×2): 30 mg via INTRAVENOUS
  Administered 2019-12-24: 20 mg via INTRAVENOUS

## 2019-12-24 MED ORDER — LIDOCAINE HCL (CARDIAC) PF 100 MG/5ML IV SOSY
PREFILLED_SYRINGE | INTRAVENOUS | Status: DC | PRN
  Administered 2019-12-24: 40 mg via INTRAVENOUS

## 2019-12-24 MED ORDER — MENTHOL 3 MG MT LOZG: 1.0000 | LOZENGE | OROMUCOSAL | Status: DC | PRN

## 2019-12-24 MED ORDER — DEXAMETHASONE SODIUM PHOSPHATE 10 MG/ML IJ SOLN
INTRAMUSCULAR | Status: AC
  Filled 2019-12-24: qty 1

## 2019-12-24 MED ORDER — SODIUM CHLORIDE 0.9 % IV SOLN: INTRAVENOUS | Status: DC

## 2019-12-24 MED ORDER — FENTANYL CITRATE (PF) 100 MCG/2ML IJ SOLN: 25.0000 ug | INTRAMUSCULAR | Status: DC | PRN

## 2019-12-24 MED ORDER — CEFAZOLIN SODIUM-DEXTROSE 2-4 GM/100ML-% IV SOLN
2.0000 g | INTRAVENOUS | Status: AC
  Administered 2019-12-24: 2 g via INTRAVENOUS

## 2019-12-24 MED ORDER — ASPIRIN 81 MG PO CHEW
81.0000 mg | CHEWABLE_TABLET | Freq: Two times a day (BID) | ORAL | Status: DC
  Administered 2019-12-24 – 2019-12-25 (×2): 81 mg via ORAL
  Filled 2019-12-24 (×2): qty 1

## 2019-12-24 MED ORDER — 0.9 % SODIUM CHLORIDE (POUR BTL) OPTIME
TOPICAL | Status: DC | PRN
  Administered 2019-12-24: 1000 mL

## 2019-12-24 MED ORDER — ORAL CARE MOUTH RINSE: 15.0000 mL | Freq: Once | OROMUCOSAL | Status: AC

## 2019-12-24 MED ORDER — GABAPENTIN 100 MG PO CAPS
100.0000 mg | ORAL_CAPSULE | Freq: Three times a day (TID) | ORAL | Status: DC
  Administered 2019-12-24 – 2019-12-25 (×2): 100 mg via ORAL
  Filled 2019-12-24 (×2): qty 1

## 2019-12-24 MED ORDER — LIDOCAINE 2% (20 MG/ML) 5 ML SYRINGE
INTRAMUSCULAR | Status: AC
  Filled 2019-12-24: qty 5

## 2019-12-24 MED ORDER — PHENOL 1.4 % MT LIQD: 1.0000 | OROMUCOSAL | Status: DC | PRN

## 2019-12-24 MED ORDER — OXYCODONE HCL 5 MG PO TABS
10.0000 mg | ORAL_TABLET | ORAL | Status: DC | PRN
  Administered 2019-12-24 – 2019-12-25 (×2): 10 mg via ORAL
  Filled 2019-12-24: qty 2

## 2019-12-24 MED ORDER — DIPHENHYDRAMINE HCL 12.5 MG/5ML PO ELIX: 12.5000 mg | ORAL_SOLUTION | ORAL | Status: DC | PRN

## 2019-12-24 MED ORDER — PROMETHAZINE HCL 25 MG/ML IJ SOLN: 6.2500 mg | INTRAMUSCULAR | Status: DC | PRN

## 2019-12-24 MED ORDER — TRANEXAMIC ACID-NACL 1000-0.7 MG/100ML-% IV SOLN
INTRAVENOUS | Status: AC
  Filled 2019-12-24: qty 100

## 2019-12-24 MED ORDER — CEFAZOLIN SODIUM-DEXTROSE 1-4 GM/50ML-% IV SOLN
1.0000 g | Freq: Four times a day (QID) | INTRAVENOUS | Status: AC
  Administered 2019-12-24 (×2): 1 g via INTRAVENOUS
  Filled 2019-12-24 (×2): qty 50

## 2019-12-24 MED ORDER — FLUDROCORTISONE ACETATE 0.1 MG PO TABS
0.1000 mg | ORAL_TABLET | Freq: Every day | ORAL | Status: DC
  Filled 2019-12-24: qty 1

## 2019-12-24 MED ORDER — LACTATED RINGERS IV SOLN: INTRAVENOUS | Status: DC

## 2019-12-24 MED ORDER — CHLORHEXIDINE GLUCONATE 0.12 % MT SOLN
15.0000 mL | Freq: Once | OROMUCOSAL | Status: AC
  Administered 2019-12-24: 15 mL via OROMUCOSAL

## 2019-12-24 MED ORDER — MIDAZOLAM HCL 2 MG/2ML IJ SOLN
INTRAMUSCULAR | Status: DC | PRN
  Administered 2019-12-24: 2 mg via INTRAVENOUS

## 2019-12-24 MED ORDER — OXYCODONE HCL 5 MG PO TABS
5.0000 mg | ORAL_TABLET | ORAL | Status: DC | PRN
  Administered 2019-12-25 (×2): 10 mg via ORAL
  Filled 2019-12-24 (×3): qty 2

## 2019-12-24 MED ORDER — FENTANYL CITRATE (PF) 100 MCG/2ML IJ SOLN
INTRAMUSCULAR | Status: AC
  Filled 2019-12-24: qty 2

## 2019-12-24 MED ORDER — METOCLOPRAMIDE HCL 5 MG/ML IJ SOLN: 5.0000 mg | Freq: Three times a day (TID) | INTRAMUSCULAR | Status: DC | PRN

## 2019-12-24 MED ORDER — ALUM & MAG HYDROXIDE-SIMETH 200-200-20 MG/5ML PO SUSP: 30.0000 mL | ORAL | Status: DC | PRN

## 2019-12-24 MED ORDER — METOCLOPRAMIDE HCL 5 MG PO TABS: 5.0000 mg | ORAL_TABLET | Freq: Three times a day (TID) | ORAL | Status: DC | PRN

## 2019-12-24 MED ORDER — PROPOFOL 500 MG/50ML IV EMUL
INTRAVENOUS | Status: DC | PRN
  Administered 2019-12-24: 120 ug/kg/min via INTRAVENOUS

## 2019-12-24 MED ORDER — HYDROMORPHONE HCL 1 MG/ML IJ SOLN: 0.5000 mg | INTRAMUSCULAR | Status: DC | PRN

## 2019-12-24 MED ORDER — PROPOFOL 1000 MG/100ML IV EMUL
INTRAVENOUS | Status: AC
  Filled 2019-12-24: qty 100

## 2019-12-24 MED ORDER — POLYETHYLENE GLYCOL 3350 17 G PO PACK: 17.0000 g | PACK | Freq: Every day | ORAL | Status: DC | PRN

## 2019-12-24 SURGICAL SUPPLY — 40 items
BAG ZIPLOCK 12X15 (MISCELLANEOUS) IMPLANT
BENZOIN TINCTURE PRP APPL 2/3 (GAUZE/BANDAGES/DRESSINGS) ×3 IMPLANT
BLADE SAW SGTL 18X1.27X75 (BLADE) ×2 IMPLANT
BLADE SAW SGTL 18X1.27X75MM (BLADE) ×1
CLOSURE WOUND 1/2 X4 (GAUZE/BANDAGES/DRESSINGS) ×1
COVER PERINEAL POST (MISCELLANEOUS) ×3 IMPLANT
COVER SURGICAL LIGHT HANDLE (MISCELLANEOUS) ×3 IMPLANT
COVER WAND RF STERILE (DRAPES) ×3 IMPLANT
CUP SECTOR GRIPTON 50MM (Cup) ×3 IMPLANT
DRAPE STERI IOBAN 125X83 (DRAPES) ×3 IMPLANT
DRAPE U-SHAPE 47X51 STRL (DRAPES) ×6 IMPLANT
DRSG AQUACEL AG ADV 3.5X10 (GAUZE/BANDAGES/DRESSINGS) ×3 IMPLANT
DURAPREP 26ML APPLICATOR (WOUND CARE) ×3 IMPLANT
ELECT REM PT RETURN 15FT ADLT (MISCELLANEOUS) ×3 IMPLANT
GAUZE XEROFORM 1X8 LF (GAUZE/BANDAGES/DRESSINGS) ×3 IMPLANT
GLOVE BIO SURGEON STRL SZ7.5 (GLOVE) ×3 IMPLANT
GLOVE BIOGEL PI IND STRL 8 (GLOVE) ×2 IMPLANT
GLOVE BIOGEL PI INDICATOR 8 (GLOVE) ×4
GLOVE ECLIPSE 8.0 STRL XLNG CF (GLOVE) ×3 IMPLANT
GOWN STRL REUS W/TWL XL LVL3 (GOWN DISPOSABLE) ×6 IMPLANT
HANDPIECE INTERPULSE COAX TIP (DISPOSABLE) ×2
HEAD FEMORAL 32 CERAMIC (Hips) ×3 IMPLANT
HOLDER FOLEY CATH W/STRAP (MISCELLANEOUS) ×3 IMPLANT
KIT TURNOVER KIT A (KITS) IMPLANT
LINER ACETABULAR 32X50 (Liner) ×3 IMPLANT
PACK ANTERIOR HIP CUSTOM (KITS) ×3 IMPLANT
PENCIL SMOKE EVACUATOR (MISCELLANEOUS) IMPLANT
SET HNDPC FAN SPRY TIP SCT (DISPOSABLE) ×1 IMPLANT
STAPLER VISISTAT 35W (STAPLE) IMPLANT
STEM CORAIL KA10 (Stem) ×3 IMPLANT
STRIP CLOSURE SKIN 1/2X4 (GAUZE/BANDAGES/DRESSINGS) ×2 IMPLANT
SUT ETHIBOND NAB CT1 #1 30IN (SUTURE) ×3 IMPLANT
SUT ETHILON 2 0 PS N (SUTURE) IMPLANT
SUT MNCRL AB 4-0 PS2 18 (SUTURE) ×3 IMPLANT
SUT VIC AB 0 CT1 36 (SUTURE) ×3 IMPLANT
SUT VIC AB 1 CT1 36 (SUTURE) ×3 IMPLANT
SUT VIC AB 2-0 CT1 27 (SUTURE) ×4
SUT VIC AB 2-0 CT1 TAPERPNT 27 (SUTURE) ×2 IMPLANT
TRAY FOL W/BAG SLVR 16FR STRL (SET/KITS/TRAYS/PACK) ×1 IMPLANT
TRAY FOLEY W/BAG SLVR 16FR LF (SET/KITS/TRAYS/PACK) ×2

## 2019-12-24 NOTE — Anesthesia Postprocedure Evaluation (Signed)
Anesthesia Post Note  Patient: Erica Welch  Procedure(s) Performed: RIGHT TOTAL HIP ARTHROPLASTY ANTERIOR APPROACH (Right Hip)     Patient location during evaluation: PACU Anesthesia Type: Spinal Level of consciousness: oriented, awake and alert and awake Pain management: pain level controlled Vital Signs Assessment: post-procedure vital signs reviewed and stable Respiratory status: spontaneous breathing, respiratory function stable and patient connected to nasal cannula oxygen Cardiovascular status: blood pressure returned to baseline and stable Postop Assessment: no headache, no backache, no apparent nausea or vomiting, patient able to bend at knees and spinal receding Anesthetic complications: no   No complications documented.  Last Vitals:  Vitals:   12/24/19 1200 12/24/19 1257  BP: 101/64 109/69  Pulse: (!) 45 63  Resp: 18 18  Temp: 36.8 C 36.8 C  SpO2: 100% 100%    Last Pain:  Vitals:   12/24/19 1257  TempSrc: Oral  PainSc:                  Catalina Gravel

## 2019-12-24 NOTE — Anesthesia Procedure Notes (Signed)
Spinal  Patient location during procedure: OR Start time: 12/24/2019 7:19 AM End time: 12/24/2019 7:22 AM Staffing Performed: resident/CRNA  Anesthesiologist: Catalina Gravel, MD Resident/CRNA: Raenette Rover, CRNA Preanesthetic Checklist Completed: patient identified, IV checked, site marked, risks and benefits discussed, surgical consent, monitors and equipment checked, pre-op evaluation and timeout performed Spinal Block Patient position: sitting Prep: DuraPrep Patient monitoring: blood pressure, continuous pulse ox and heart rate Approach: midline Location: L3-4 Injection technique: single-shot Needle Needle type: Pencan  Needle gauge: 24 G Assessment Sensory level: T6

## 2019-12-24 NOTE — Progress Notes (Signed)
Orthopedic Tech Progress Note Patient Details:  Erica Welch Oct 07, 1985 [id number]  Ortho Devices Ortho Device/Splint Location: applied overhead frame Ortho Device/Splint Interventions: Ordered, Application   Post Interventions Patient Tolerated: Well Instructions Provided: Care of device   [name] 12/24/2019, 12:33 PM

## 2019-12-24 NOTE — Transfer of Care (Signed)
Immediate Anesthesia Transfer of Care Note  Patient: Erica Welch  Procedure(s) Performed: RIGHT TOTAL HIP ARTHROPLASTY ANTERIOR APPROACH (Right Hip)  Patient Location: PACU  Anesthesia Type:Spinal  Level of Consciousness: awake, alert , oriented and patient cooperative  Airway & Oxygen Therapy: Patient Spontanous Breathing and Patient connected to face mask oxygen  Post-op Assessment: Report given to RN and Post -op Vital signs reviewed and stable  Post vital signs: Reviewed and stable  Last Vitals:  Vitals Value Taken Time  BP 91/55 12/24/19 0846  Temp    Pulse 59 12/24/19 0849  Resp 15 12/24/19 0849  SpO2 100 % 12/24/19 0849  Vitals shown include unvalidated device data.  Last Pain:  Vitals:   12/24/19 0626  TempSrc: Oral         Complications: No complications documented.

## 2019-12-24 NOTE — Evaluation (Signed)
Physical Therapy Evaluation Patient Details Name: Erica Welch MRN: [id number] DOB: 09-Sep-1985 Today's Date: 12/24/2019   History of Present Illness  Pt s/p R THR and with hx of fibromyalgia and bilat hip AVN  Clinical Impression  Pt s/p R THR and presents with decreased R LE strength/ROM, post op pain, and nausea limiting functional mobility.  Pt should progress to dc home with family assist and HHPT follow up.    Follow Up Recommendations Home health PT    Equipment Recommendations  Rolling walker with 5" wheels;3in1 (PT)    Recommendations for Other Services       Precautions / Restrictions Precautions Precautions: Fall Restrictions Weight Bearing Restrictions: No RLE Weight Bearing: Weight bearing as tolerated      Mobility  Bed Mobility Overal bed mobility: Needs Assistance Bed Mobility: Supine to Sit;Sit to Supine     Supine to sit: Min assist Sit to supine: Min assist   General bed mobility comments: cues for sequence and use of L LE to self assist  Transfers Overall transfer level: Needs assistance Equipment used: Rolling walker (2 wheeled) Transfers: Sit to/from Stand Sit to Stand: Min assist         General transfer comment: cues for LE management and use of UEs to self assist  Ambulation/Gait Ambulation/Gait assistance: Min assist Gait Distance (Feet): 3 Feet Assistive device: Rolling walker (2 wheeled) Gait Pattern/deviations: Step-to pattern;Decreased step length - right;Decreased step length - left;Shuffle;Drifts right/left Gait velocity: decr   General Gait Details: cues for posture and position from RW; pt side-stepped up side of bed only - ltd by nausea and pain  Stairs            Wheelchair Mobility    Modified Rankin (Stroke Patients Only)       Balance Overall balance assessment: Mild deficits observed, not formally tested                                           Pertinent Vitals/Pain Pain Assessment:  0-10 Pain Score: 7  Pain Location: R hip Pain Descriptors / Indicators: Aching;Sore Pain Intervention(s): Limited activity within patient's tolerance;Monitored during session;Premedicated before session;Ice applied    Home Living Family/patient expects to be discharged to:: Private residence Living Arrangements: Parent Available Help at Discharge: Family Type of Home: House Home Access: Ramped entrance     [place]: Able to live on main level with bedroom/bathroom Home Equipment: None      Prior Function Level of Independence: Independent               Hand Dominance        Extremity/Trunk Assessment   Upper Extremity Assessment Upper Extremity Assessment: Overall WFL for tasks assessed    Lower Extremity Assessment Lower Extremity Assessment: RLE deficits/detail    Cervical / Trunk Assessment Cervical / Trunk Assessment: Normal  Communication   Communication: No difficulties  Cognition Arousal/Alertness: Awake/alert Behavior During Therapy: WFL for tasks assessed/performed Overall Cognitive Status: Within Functional Limits for tasks assessed                                        General Comments      Exercises Total Joint Exercises Ankle Circles/Pumps: AROM;Both;15 reps;Supine   Assessment/Plan    PT Assessment Patient needs continued  PT services  PT Problem List Decreased strength;Decreased range of motion;Decreased activity tolerance;Decreased balance;Decreased mobility;Decreased knowledge of use of DME;Pain       PT Treatment Interventions DME instruction;Gait training;Stair training;Functional mobility training;Therapeutic activities;Therapeutic exercise;Patient/family education    PT Goals (Current goals can be found in the Care Plan section)  Acute Rehab PT Goals Patient Stated Goal: Regain IND PT Goal Formulation: With patient Time For Goal Achievement: 12/31/19 Potential to Achieve Goals: Good    Frequency  7X/week   Barriers to discharge        Co-evaluation               AM-PAC PT "6 Clicks" Mobility  Outcome Measure Help needed turning from your back to your side while in a flat bed without using bedrails?: A Little Help needed moving from lying on your back to sitting on the side of a flat bed without using bedrails?: A Little Help needed moving to and from a bed to a chair (including a wheelchair)?: A Little Help needed standing up from a chair using your arms (e.g., wheelchair or bedside chair)?: A Little Help needed to walk in hospital room?: A Little Help needed climbing 3-5 steps with a railing? : A Lot 6 Click Score: 17    End of Session Equipment Utilized During Treatment: Gait belt Activity Tolerance: Patient limited by pain;Other (comment) (nausea) Patient left: in bed;with call bell/phone within reach;with bed alarm set;with nursing/sitter in room Nurse Communication: Mobility status PT Visit Diagnosis: Difficulty in walking, not elsewhere classified (R26.2)    Time: 1683-7290 PT Time Calculation (min) (ACUTE ONLY): 33 min   Charges:   PT Evaluation $PT Eval Low Complexity: 1 Low PT Treatments $Therapeutic Activity: 8-22 mins        Debe Coder PT Acute Rehabilitation Services Pager [phone] Office [phone]   Erica Welch 12/24/2019, 5:33 PM

## 2019-12-24 NOTE — TOC Transition Note (Signed)
Transition of Care [facility]) - CM/SW Discharge Note   Patient Details  Name: Satori Krabill MRN: [id number] Date of Birth: 1985-07-03  [facility]) CM/SW Contact:  Lennart Pall, LCSW Phone Number: 12/24/2019, 3:24 PM   Clinical Narrative:    Met briefly with pt to confirm dc needs.  DME ordered (see below) and HHPT pre-arranged with [facility].  No further TOC needs.   Final next level of care: [place] Barriers to Discharge: Continued Medical Work up   Patient Goals and CMS Choice Patient states their goals for this hospitalization and ongoing recovery are:: go home CMS Medicare.gov Compare Post Acute Care list provided to:: Patient Choice offered to / list presented to : Patient  Discharge Placement                       Discharge Plan and Services                DME Arranged: 3-N-1, Walker rolling DME Agency: [employer] Date DME Agency Contacted: 12/24/19 Time DME Agency Contacted: [phone] Representative spoke with at DME Agency: [place]: PT [place]: [facility]formerly [employer]) Date [place]:  (arranged via MD office PTA)      Social Determinants of Health ([place]) Interventions     Readmission Risk Interventions No flowsheet data found.

## 2019-12-24 NOTE — Brief Op Note (Signed)
12/24/2019  8:24 AM  PATIENT:  Erica Welch  34 y.o. female  PRE-OPERATIVE DIAGNOSIS:  avascular necrosis right hip  POST-OPERATIVE DIAGNOSIS:  * No post-op diagnosis entered *  PROCEDURE:  Procedure(s): RIGHT TOTAL HIP ARTHROPLASTY ANTERIOR APPROACH (Right)  SURGEON:  Surgeon(s) and Role:    [name], MD - Primary  PHYSICIAN ASSISTANT:  Benita Stabile, PA-C  ANESTHESIA:   spinal  EBL:  100 mL   COUNTS:  YES  DICTATION: .Other Dictation: Dictation Number [id number]  PLAN OF CARE: Admit for overnight observation  PATIENT DISPOSITION:  PACU - hemodynamically stable.   Delay start of Pharmacological VTE agent (>24hrs) due to surgical blood loss or risk of bleeding: no

## 2019-12-24 NOTE — Progress Notes (Signed)
Orthopedic Tech Progress Note Patient Details:  Erica Welch 09/27/1985 [id number]  Ortho Devices Ortho Device/Splint Location: applied overhead frame Ortho Device/Splint Interventions: Ordered, Application   Post Interventions Patient Tolerated: Well Instructions Provided: Care of device   [name] 12/24/2019, 12:34 PM

## 2019-12-24 NOTE — Interval H&P Note (Signed)
History and Physical Interval Note: The patient understands fully that she is here today for right total hip arthroplasty to treat her right hip avascular necrosis.  There has been no acute change in her medical status.  See recent H&P.  The risk and benefits of surgery been explained in detail and informed consent is obtained.  The right hip is been marked.  12/24/2019 7:06 AM  Erica Welch  has presented today for surgery, with the diagnosis of avascular necrosis right hip.  The various methods of treatment have been discussed with the patient and family. After consideration of risks, benefits and other options for treatment, the patient has consented to  Procedure(s): RIGHT TOTAL HIP ARTHROPLASTY ANTERIOR APPROACH (Right) as a surgical intervention.  The patient's history has been reviewed, patient examined, no change in status, stable for surgery.  I have reviewed the patient's chart and labs.  Questions were answered to the patient's satisfaction.     [name]

## 2019-12-24 NOTE — Op Note (Addendum)
NAMERAINY, ROTHMAN MEDICAL RECORD DS:[id number] ACCOUNT [id number] DATE OF BIRTH:1985/10/01 FACILITY: [facility] LOCATION: [facility] PHYSICIAN:Pasha Gadison Kerry Fort, MD  OPERATIVE REPORT  DATE OF PROCEDURE:  12/24/2019  PREOPERATIVE DIAGNOSIS:  Avascular necrosis, right hip.  POSTOPERATIVE DIAGNOSIS:  Avascular necrosis, right hip.  PROCEDURE:  Right total hip arthroplasty through direct anterior approach.  IMPLANTS:  DePuy Sector Gription acetabular component size 50, size 32+0 neutral polyethylene liner, size 10 Corail femoral component with standard offset, size 32+1 ceramic hip ball.  SURGEON:  Lind Guest. Ninfa Linden, MD  ASSISTANT:  Erskine Emery, PA-C  ANESTHESIA:  Spinal.  ANTIBIOTICS:  Two grams IV Ancef.  ESTIMATED BLOOD LOSS:  100 mL.  COMPLICATIONS:  None.  INDICATIONS:  The patient is a 34 year old female who unfortunately has avascular necrosis of both her hips.  This is probably induced by long-term steroid use from previous other medical issues as well as chemotherapy.  This is evident on her clinical  exam and plain films as well as MRI of both hips with the right worse than left.  Her pain is daily and it is severe.  This is rapidly come on.  We have recommended total hip arthroplasty.  We are proceeding with the right hip first.  We had a long and  thorough discussion about the surgery including the risk of acute blood loss anemia, nerve or vessel injury, fracture, infection, dislocation, DVT and implant failure.  We talked about the goals being decreased pain, improve mobility and overall improve  quality of life.  DESCRIPTION OF PROCEDURE:  After informed consent was obtained and appropriate right hip was marked.  She was brought to the operating room and sat up on a stretcher where spinal anesthesia was obtained.  She was then laid in supine position on a  stretcher.  Foley catheter was placed.  I was able to truly assess her leg lengths.  On standing films in  the office, it appears that her left leg is longer than the right, but clinically she is not.  She is a more less close to equal.  We then placed  her supine on the Hana fracture table, the perineal post in place and both legs in in-line skeletal traction with no traction applied.  I then assessed her hip again radiographically and it does appear that she is shorter on the right than the left, but  we know clinically she is not so we need to incorporate that into our final x-rays and positioning of the implants.  Her right operative hip was then prepped and draped with DuraPrep and sterile drapes.  A time-out was called.  She was identified as  correct patient, correct right hip.  I then made an incision just inferior and posterior to the anterior superior iliac spine and carried this obliquely down the leg.  We dissected down tensor fascia lata muscle.  Tensor fascia was then divided  longitudinally to proceed with direct anterior approach to the hip.  We identified and cauterized circumflex vessels and identified the hip capsule, opened the hip capsule in an L-type format finding a moderate joint effusion.  We placed curved  retractors around the medial and lateral femoral neck and made our femoral neck cut with an oscillating saw and completed this with an osteotome.  This was all proximal to the lesser trochanter.  Placed a corkscrew guide in the femoral head and found  fibrillated cartilage consistent with avascular necrosis.  I then placed a bent Hohmann over the medial acetabular rim and  removed remnants of the acetabular labrum and other debris.  I then began reaming under direct visualization from a size 44 reamer  in stepwise increments up to a size 49.  With all reamers placed under direct visualization, the last reamer was placed under direct fluoroscopy so I could obtain my depth of reaming, my inclination and anteversion.  I then placed the real DePuy Sector  Gription acetabular component size  50 with a 32+0 neutral polyethylene liner placed in that acetabulum component.  Attention was then turned to the femur.  With the leg externally rotated to 120 degrees, extended and adducted, I was able to place a  Mueller retractor medially and Hohman retractor on the greater trochanter, released lateral joint capsule and used a box-cutting osteotome to enter the femoral canal and a rongeur to lateralize.  Then began broaching using the Corail broaching system  from a size 8 going up to a size 10.  With a size 10 trial in place, we trialed a standard offset femoral neck and a 32+1 hip ball.  We reduced this in the acetabulum.  We were pleased with the leg length, offset, range of motion and stability assessed  mechanically and radiographically.  We then dislocated the hip and removed the trial components.  I placed the real Corail femoral component size 10 with standard offset and the real 32+1 ceramic hip ball and again reduced this in the acetabulum and we  appreciated the stability and assessed it radiographically and mechanically.  We then irrigated the soft tissue with normal saline solution using pulsatile lavage.  I was able to reapproximate the joint capsule with interrupted #1 Ethibond suture, #1  Vicryl was used to close the tensor fascia, 0 Vicryl was used to close the deep tissue, 2-0 Vicryl was used to close subcutaneous tissue, and 4-0 Monocryl subcuticular stitch with Steri-Strips applied and Aquacel dressing was then placed.  She was taken  off the Hana table and taken to recovery room in stable condition.  All final counts were correct.  There were no complications noted.  Of note, Benita Stabile, PA-C, assisted during the entire case and his assistance was crucial for facilitating all aspects  of this case.  CN/NUANCE  D:12/24/2019 T:12/24/2019 JOB:[id number]

## 2019-12-25 DIAGNOSIS — M797 Fibromyalgia: Secondary | ICD-10-CM | POA: Diagnosis not present

## 2019-12-25 DIAGNOSIS — Z91040 Latex allergy status: Secondary | ICD-10-CM | POA: Diagnosis not present

## 2019-12-25 DIAGNOSIS — Z8542 Personal history of malignant neoplasm of other parts of uterus: Secondary | ICD-10-CM | POA: Diagnosis not present

## 2019-12-25 DIAGNOSIS — Z87891 Personal history of nicotine dependence: Secondary | ICD-10-CM | POA: Diagnosis not present

## 2019-12-25 DIAGNOSIS — M87052 Idiopathic aseptic necrosis of left femur: Secondary | ICD-10-CM | POA: Diagnosis not present

## 2019-12-25 DIAGNOSIS — M87051 Idiopathic aseptic necrosis of right femur: Secondary | ICD-10-CM | POA: Diagnosis not present

## 2019-12-25 LAB — BASIC METABOLIC PANEL
Anion gap: 6 (ref 5–15)
BUN: 9 mg/dL (ref 6–20)
CO2: 24 mmol/L (ref 22–32)
Calcium: 8.2 mg/dL — ABNORMAL LOW (ref 8.9–10.3)
Chloride: 101 mmol/L (ref 98–111)
Creatinine, Ser: 0.59 mg/dL (ref 0.44–1.00)
GFR calc Af Amer: >60 mL/min (ref >60)
GFR calc non Af Amer: >60 mL/min (ref >60)
Glucose, Bld: 112 mg/dL — ABNORMAL HIGH (ref 70–99)
Potassium: 3.8 mmol/L (ref 3.5–5.1)
Sodium: 131 mmol/L — ABNORMAL LOW (ref 135–145)

## 2019-12-25 LAB — CBC
HCT: 35.5 % — ABNORMAL LOW (ref 36.0–46.0)
Hemoglobin: 12 g/dL (ref 12.0–15.0)
MCH: 33.1 pg (ref 26.0–34.0)
MCHC: 33.8 g/dL (ref 30.0–36.0)
MCV: 98.1 fL (ref 80.0–100.0)
Platelets: 208 10*3/uL (ref 150–400)
RBC: 3.62 MIL/uL — ABNORMAL LOW (ref 3.87–5.11)
RDW: 12.3 % (ref 11.5–15.5)
WBC: 12 10*3/uL — ABNORMAL HIGH (ref 4.0–10.5)
nRBC: 0 % (ref 0.0–0.2)

## 2019-12-25 MED ORDER — METHOCARBAMOL 500 MG PO TABS: 500.0000 mg | ORAL_TABLET | Freq: Four times a day (QID) | ORAL | 1 refills | Status: DC | PRN

## 2019-12-25 MED ORDER — OXYCODONE HCL 5 MG PO TABS: 5.0000 mg | ORAL_TABLET | ORAL | 0 refills | Status: DC | PRN

## 2019-12-25 MED ORDER — ASPIRIN 81 MG PO CHEW: 81.0000 mg | CHEWABLE_TABLET | Freq: Two times a day (BID) | ORAL | 0 refills | Status: DC

## 2019-12-25 NOTE — Plan of Care (Signed)
  Problem: Education: Goal: Knowledge of the prescribed therapeutic regimen will improve Outcome: Progressing Goal: Understanding of discharge needs will improve Outcome: Progressing Goal: Individualized Educational Video(s) Outcome: Progressing   Problem: Pain Management: Goal: Pain level will decrease with appropriate interventions Outcome: Progressing   Problem: Education: Goal: Knowledge of General Education information will improve Description: Including pain rating scale, medication(s)/side effects and non-pharmacologic comfort measures Outcome: Progressing   

## 2019-12-25 NOTE — Progress Notes (Signed)
Subjective: 1 Day Post-Op Procedure(s) (LRB): RIGHT TOTAL HIP ARTHROPLASTY ANTERIOR APPROACH (Right) Patient reports pain as moderate.    Objective: Vital signs in last 24 hours: Temp:  [98.2 F (36.8 C)-98.9 F (37.2 C)] 98.9 F (37.2 C) (08/14 1030) Pulse Rate:  [45-65] 55 (08/14 1030) Resp:  [14-18] 14 (08/14 1030) BP: (100-115)/(61-80) 110/80 (08/14 1030) SpO2:  [98 %-100 %] 100 % (08/14 1030) Weight:  [65.9 kg] 65.9 kg (08/13 1531)  Intake/Output from previous day: 08/13 0701 - 08/14 0700 In: 3533.5 [P.O.:600; I.V.:2683.5; IV Piggyback:250] Out: 1800 [Urine:1700; Blood:100] Intake/Output this shift: No intake/output data recorded.  Recent Labs    12/25/19 0255  HGB 12.0   Recent Labs    12/25/19 0255  WBC 12.0*  RBC 3.62*  HCT 35.5*  PLT 208   Recent Labs    12/25/19 0255  NA 131*  K 3.8  CL 101  CO2 24  BUN 9  CREATININE 0.59  GLUCOSE 112*  CALCIUM 8.2*   No results for input(s): LABPT, INR in the last 72 hours.  Sensation intact distally Intact pulses distally Dorsiflexion/Plantar flexion intact Incision: scant drainage   Assessment/Plan: 1 Day Post-Op Procedure(s) (LRB): RIGHT TOTAL HIP ARTHROPLASTY ANTERIOR APPROACH (Right) Up with therapy Discharge home with home health    Patient's anticipated LOS is less than 2 midnights, meeting these requirements: - Younger than 74 - Lives within 1 hour of care - Has a competent adult at home to recover with post-op recover - NO history of  - Chronic pain requiring opiods  - Diabetes  - Coronary Artery Disease  - Heart failure  - Heart attack  - Stroke  - DVT/VTE  - Cardiac arrhythmia  - Respiratory Failure/COPD  - Renal failure  - Anemia  - Advanced Liver disease       Erica Welch 12/25/2019, 10:41 AM

## 2019-12-25 NOTE — TOC Progression Note (Signed)
Transition of Care [facility]) - Progression Note    Patient Details  Name: Erica Welch MRN: [id number] Date of Birth: [birth year]  [facility]) CM/SW Contact  [name], RN Phone Number: 12/25/2019, 1:07 PM  Clinical Narrative:    CM spoke with patient who reports she will be staying in [place] with her mother and that [facility] doe snot provide services in that area.  Her mother has been working on Magazine features editor through [employer].  CM spoke with mother, [name], who reports she is employed with the [employer] branch in [place] and their administrator is prepared to accept patient for services once referral is received.  CM provided referral information to local [employer] branch which was forwarded to the [facility] branch.     Expected Discharge Plan: [place] Barriers to Discharge: No Barriers Identified  Expected Discharge Plan and Services Expected Discharge Plan: [place]         Expected Discharge Date: 12/25/19               DME Arranged: 3-N-1, Walker rolling DME Agency: [employer] Date DME Agency Contacted: 12/24/19 Time DME Agency Contacted: [phone] Representative spoke with at DME Agency: [place]: PT [place]: [place] ([place]) Date [place]: 12/25/19 Time [place]: 1307 Representative spoke with at [place]: [place] ([place]) Interventions    Readmission Risk Interventions No flowsheet data found.

## 2019-12-25 NOTE — Discharge Summary (Signed)
Patient ID: Erica Welch MRN: [id number] DOB/AGE: 1985/10/10 34 y.o.  Admit date: 12/24/2019 Discharge date: 12/25/2019  Admission Diagnoses:  Principal Problem:   Avascular necrosis of hip, right ([place]) Active Problems:   Status post total replacement of right hip   Discharge Diagnoses:  Same  Past Medical History:  Diagnosis Date  . AV nodal tachycardia ([place]) 2017  . Cancer ([place])    uterine   . Cubital tunnel syndrome 2017  . Dyspareunia due to medical condition in female   . Dysrhythmia    SVT  . Endometriosis   . Fibromyalgia   . GERD (gastroesophageal reflux disease)   . History of kidney stones   . Hyperlipidemia   . Hypermobility syndrome   . IBS (irritable bowel syndrome)   . Insomnia   . Memory loss   . Migraine   . Occipital neuralgia 2017  . Patellofemoral arthralgia of both knees   . Pneumonia    as a baby  . Right flank pain 2017  . Rotator cuff tendinitis 2017  . Vitamin B12 deficiency   . Vitamin D deficiency     Surgeries: Procedure(s): RIGHT TOTAL HIP ARTHROPLASTY ANTERIOR APPROACH on 12/24/2019   Consultants:   Discharged Condition: Improved  Hospital Course: Erica Welch is an 34 y.o. female who was admitted 12/24/2019 for operative treatment ofAvascular necrosis of hip, right ([place]). Patient has severe unremitting pain that affects sleep, daily activities, and work/hobbies. After pre-op clearance the patient was taken to the operating room on 12/24/2019 and underwent  Procedure(s): RIGHT TOTAL HIP ARTHROPLASTY ANTERIOR APPROACH.    Patient was given perioperative antibiotics:  Anti-infectives (From admission, onward)   Start     Dose/Rate Route Frequency Ordered Stop   12/24/19 1400  ceFAZolin (ANCEF) IVPB 1 g/50 mL premix        1 g 100 mL/hr over 30 Minutes Intravenous Every 6 hours 12/24/19 1041 12/24/19 2001   12/24/19 0600  ceFAZolin (ANCEF) IVPB 2g/100 mL premix        2 g 200 mL/hr over 30 Minutes Intravenous On call to O.R. 12/24/19  1638 12/24/19 0726   12/24/19 0526  ceFAZolin (ANCEF) 2-4 GM/100ML-% IVPB       Note to Pharmacy: Charmayne Sheer   : cabinet override      12/24/19 0526 12/24/19 0741       Patient was given sequential compression devices, early ambulation, and chemoprophylaxis to prevent DVT.  Patient benefited maximally from hospital stay and there were no complications.    Recent vital signs:  Patient Vitals for the past 24 hrs:  BP Temp Temp src Pulse Resp SpO2 Height Weight  12/25/19 1030 110/80 98.9 F (37.2 C) Oral (!) 55 14 100 % -- --  12/25/19 0614 115/72 98.6 F (37 C) Oral (!) 50 16 99 % -- --  12/25/19 0230 112/76 98.5 F (36.9 C) Oral (!) 59 16 100 % -- --  12/24/19 2258 100/61 98.4 F (36.9 C) Oral 65 16 98 % -- --  12/24/19 1531 -- -- -- -- -- -- 5' 7.99" (1.727 m) 65.9 kg  12/24/19 1408 103/63 98.7 F (37.1 C) Oral (!) 51 18 100 % -- --  12/24/19 1257 109/69 98.2 F (36.8 C) Oral 63 18 100 % -- --  12/24/19 1200 101/64 98.2 F (36.8 C) Oral (!) 45 18 100 % -- --     Recent laboratory studies:  Recent Labs    12/25/19 0255  WBC 12.0*  HGB 12.0  HCT 35.5*  PLT 208  NA 131*  K 3.8  CL 101  CO2 24  BUN 9  CREATININE 0.59  GLUCOSE 112*  CALCIUM 8.2*     Discharge Medications:   Allergies as of 12/25/2019      Reactions   Latex Dermatitis   Strawberry Extract Hives   Adhesive [tape] Itching      Medication List    TAKE these medications   acetaminophen 500 MG tablet Commonly known as: TYLENOL Take 500-1,000 mg by mouth every 6 (six) hours as needed for headache.   aspirin 81 MG chewable tablet Chew 1 tablet (81 mg total) by mouth 2 (two) times daily.   fludrocortisone 0.1 MG tablet Commonly known as: FLORINEF Take 1 tablet (0.1 mg total) by mouth daily.   ibuprofen 200 MG tablet Commonly known as: ADVIL Take 400-800 mg by mouth every 6 (six) hours as needed for headache or moderate pain.   medroxyPROGESTERone 150 MG/ML injection Commonly known  as: DEPO-PROVERA Inject 150 mg into the muscle every 3 (three) months.   methocarbamol 500 MG tablet Commonly known as: ROBAXIN Take 1 tablet (500 mg total) by mouth every 6 (six) hours as needed for muscle spasms.   oxyCODONE 5 MG immediate release tablet Commonly known as: Oxy IR/ROXICODONE Take 1-2 tablets (5-10 mg total) by mouth every 4 (four) hours as needed for moderate pain (pain score 4-6).   Zofran 8 MG tablet Generic drug: ondansetron Take 8 mg by mouth every 8 (eight) hours as needed for nausea or vomiting.            Durable Medical Equipment  (From admission, onward)         Start     Ordered   12/24/19 1042  DME 3 n 1  Once        12/24/19 1041   12/24/19 1042  DME Walker rolling  Once       Question Answer Comment  Walker: With 5 Inch Wheels   Patient needs a walker to treat with the following condition Status post total replacement of right hip      12/24/19 1041          Diagnostic Studies: DG Pelvis Portable  Result Date: 12/24/2019 CLINICAL DATA:  Postop EXAM: PORTABLE PELVIS 1-2 VIEWS COMPARISON:  Sep 13, 2019 FINDINGS: Status post RIGHT hip arthroplasty. Orthopedic hardware is intact and without periprosthetic fracture or lucency. Pelvic phleboliths. Subcutaneous air within the RIGHT proximal thigh consistent with recent surgical intervention. IMPRESSION: Expected postsurgical appearance status post RIGHT hip arthroplasty. Electronically Signed   By: Valentino Saxon MD   On: 12/24/2019 09:27   DG C-Arm 1-60 Min-No Report  Result Date: 12/24/2019 Fluoroscopy was utilized by the requesting physician.  No radiographic interpretation.   DG HIP OPERATIVE UNILAT W OR W/O PELVIS RIGHT  Result Date: 12/24/2019 CLINICAL DATA:  Portable operative imaging provided for right total hip arthroplasty. EXAM: OPERATIVE RIGHT HIP (WITH PELVIS IF PERFORMED) 3 VIEWS TECHNIQUE: Fluoroscopic spot image(s) were submitted for interpretation post-operatively.  COMPARISON:  09/13/2019. FINDINGS: The femoral and acetabular components of the total right hip arthroplasty appear well seated and well aligned. No acute fracture or evidence of an operative complication. IMPRESSION: Well-positioned total right hip arthroplasty. Electronically Signed   By: Lajean Manes M.D.   On: 12/24/2019 08:36    Disposition: Discharge disposition: 01-Home or Self Care          Follow-up Information    [name],  MD Follow up in 2 week(s).   Specialty: Orthopedic Surgery Contact information: [address] [place] [address] [phone]                Signed: [name] 12/25/2019, 10:43 AM

## 2019-12-25 NOTE — Discharge Instructions (Signed)

## 2019-12-25 NOTE — Progress Notes (Signed)
Physical Therapy Treatment Patient Details Name: Erica Welch MRN: [id number] DOB: [birth year] Today's Date: 12/25/2019    History of Present Illness Pt s/p R THR and with hx of fibromyalgia and bilat hip AVN    PT Comments    Progressing well with mobility. Reviewed/practiced exercises, gait training, and stair training. All education completed. Okay to d/c from PT standpoint.    Follow Up Recommendations  Home health PT     Equipment Recommendations       Recommendations for Other Services       Precautions / Restrictions Precautions Precautions: Fall Restrictions Weight Bearing Restrictions: No RLE Weight Bearing: Weight bearing as tolerated    Mobility  Bed Mobility Overal bed mobility: Needs Assistance Bed Mobility: Supine to Sit     Supine to sit: Min assist Sit to supine: Min guard   General bed mobility comments: Assist for LE off bed. On return, pt practiced hooking L lEg vs using leg strap. Increased time. Task is effortful and painful.  Transfers Overall transfer level: Needs assistance Equipment used: Rolling walker (2 wheeled) Transfers: Sit to/from Stand Sit to Stand: Min guard         General transfer comment: VCs safety, hand placement.  Ambulation/Gait Ambulation/Gait assistance: Min guard Gait Distance (Feet): 150 Feet Assistive device: Rolling walker (2 wheeled) Gait Pattern/deviations: Step-to pattern;Step-through pattern;Decreased stride length;Decreased step length - left     General Gait Details: Cues for safety. Slow but steady with RW use.   Stairs Stairs: Yes Stairs assistance: Min guard Stair Management: Step to pattern;Forwards;Two rails Number of Stairs: 2 General stair comments: up and over portable stairs x 1. VCs safety, technique, sequence.   Wheelchair Mobility    Modified Rankin (Stroke Patients Only)       Balance Overall balance assessment: Mild deficits observed, not formally tested                                           Cognition Arousal/Alertness: Awake/alert Behavior During Therapy: WFL for tasks assessed/performed Overall Cognitive Status: Within Functional Limits for tasks assessed                                        Exercises Total Joint Exercises Ankle Circles/Pumps: AROM;Both;10 reps Quad Sets: AROM;Both;10 reps Heel Slides: AROM;Right;10 reps Hip ABduction/ADduction: AAROM;Right;10 reps    General Comments        Pertinent Vitals/Pain Pain Assessment: 0-10 Pain Score: 7  Pain Location: R hip Pain Descriptors / Indicators: Aching;Sore;Grimacing;Guarding Pain Intervention(s): Monitored during session;Repositioned;Ice applied    Home Living                      Prior Function            PT Goals (current goals can now be found in the care plan section) Progress towards PT goals: Progressing toward goals    Frequency    7X/week      PT Plan Current plan remains appropriate    Co-evaluation              AM-PAC PT "6 Clicks" Mobility   Outcome Measure  Help needed turning from your back to your side while in a flat bed without using bedrails?: A Little Help needed moving from lying  on your back to sitting on the side of a flat bed without using bedrails?: A Little Help needed moving to and from a bed to a chair (including a wheelchair)?: A Little Help needed standing up from a chair using your arms (e.g., wheelchair or bedside chair)?: A Little Help needed to walk in hospital room?: A Little Help needed climbing 3-5 steps with a railing? : A Little 6 Click Score: 18    End of Session Equipment Utilized During Treatment: Gait belt Activity Tolerance: Patient tolerated treatment well Patient left: in bed;with call bell/phone within reach;with family/visitor present   PT Visit Diagnosis: Other abnormalities of gait and mobility (R26.89);Pain Pain - Right/Left: Right Pain - part of body: Hip      Time: 1123-1200 PT Time Calculation (min) (ACUTE ONLY): 37 min  Charges:  $Gait Training: 23-37 mins                         Doreatha Massed, PT Acute Rehabilitation  Office: [phone] Pager: [phone]

## 2019-12-26 DIAGNOSIS — G43909 Migraine, unspecified, not intractable, without status migrainosus: Secondary | ICD-10-CM | POA: Diagnosis not present

## 2019-12-26 DIAGNOSIS — M797 Fibromyalgia: Secondary | ICD-10-CM | POA: Diagnosis not present

## 2019-12-26 DIAGNOSIS — G4700 Insomnia, unspecified: Secondary | ICD-10-CM | POA: Diagnosis not present

## 2019-12-26 DIAGNOSIS — Z96641 Presence of right artificial hip joint: Secondary | ICD-10-CM | POA: Diagnosis not present

## 2019-12-26 DIAGNOSIS — M222X1 Patellofemoral disorders, right knee: Secondary | ICD-10-CM | POA: Diagnosis not present

## 2019-12-26 DIAGNOSIS — K219 Gastro-esophageal reflux disease without esophagitis: Secondary | ICD-10-CM | POA: Diagnosis not present

## 2019-12-26 DIAGNOSIS — M222X2 Patellofemoral disorders, left knee: Secondary | ICD-10-CM | POA: Diagnosis not present

## 2019-12-26 DIAGNOSIS — K589 Irritable bowel syndrome without diarrhea: Secondary | ICD-10-CM | POA: Diagnosis not present

## 2019-12-26 DIAGNOSIS — M87052 Idiopathic aseptic necrosis of left femur: Secondary | ICD-10-CM | POA: Diagnosis not present

## 2019-12-26 DIAGNOSIS — E785 Hyperlipidemia, unspecified: Secondary | ICD-10-CM | POA: Diagnosis not present

## 2019-12-26 DIAGNOSIS — E538 Deficiency of other specified B group vitamins: Secondary | ICD-10-CM | POA: Diagnosis not present

## 2019-12-26 DIAGNOSIS — R413 Other amnesia: Secondary | ICD-10-CM | POA: Diagnosis not present

## 2019-12-26 DIAGNOSIS — Z471 Aftercare following joint replacement surgery: Secondary | ICD-10-CM | POA: Diagnosis not present

## 2019-12-27 ENCOUNTER — Encounter (HOSPITAL_COMMUNITY): Payer: Self-pay | Admitting: Orthopaedic Surgery

## 2019-12-28 DIAGNOSIS — G4700 Insomnia, unspecified: Secondary | ICD-10-CM | POA: Diagnosis not present

## 2019-12-28 DIAGNOSIS — R413 Other amnesia: Secondary | ICD-10-CM | POA: Diagnosis not present

## 2019-12-28 DIAGNOSIS — E538 Deficiency of other specified B group vitamins: Secondary | ICD-10-CM | POA: Diagnosis not present

## 2019-12-28 DIAGNOSIS — M87052 Idiopathic aseptic necrosis of left femur: Secondary | ICD-10-CM | POA: Diagnosis not present

## 2019-12-28 DIAGNOSIS — M222X2 Patellofemoral disorders, left knee: Secondary | ICD-10-CM | POA: Diagnosis not present

## 2019-12-28 DIAGNOSIS — E785 Hyperlipidemia, unspecified: Secondary | ICD-10-CM | POA: Diagnosis not present

## 2019-12-28 DIAGNOSIS — M797 Fibromyalgia: Secondary | ICD-10-CM | POA: Diagnosis not present

## 2019-12-28 DIAGNOSIS — K589 Irritable bowel syndrome without diarrhea: Secondary | ICD-10-CM | POA: Diagnosis not present

## 2019-12-28 DIAGNOSIS — M222X1 Patellofemoral disorders, right knee: Secondary | ICD-10-CM | POA: Diagnosis not present

## 2019-12-28 DIAGNOSIS — Z471 Aftercare following joint replacement surgery: Secondary | ICD-10-CM | POA: Diagnosis not present

## 2019-12-28 DIAGNOSIS — K219 Gastro-esophageal reflux disease without esophagitis: Secondary | ICD-10-CM | POA: Diagnosis not present

## 2019-12-28 DIAGNOSIS — G43909 Migraine, unspecified, not intractable, without status migrainosus: Secondary | ICD-10-CM | POA: Diagnosis not present

## 2019-12-28 DIAGNOSIS — Z96641 Presence of right artificial hip joint: Secondary | ICD-10-CM | POA: Diagnosis not present

## 2019-12-30 ENCOUNTER — Telehealth: Payer: Self-pay | Admitting: Orthopaedic Surgery

## 2019-12-30 ENCOUNTER — Ambulatory Visit: Payer: Medicaid Other | Admitting: Cardiology

## 2019-12-30 ENCOUNTER — Encounter: Payer: Self-pay | Admitting: Orthopaedic Surgery

## 2019-12-30 DIAGNOSIS — R413 Other amnesia: Secondary | ICD-10-CM | POA: Diagnosis not present

## 2019-12-30 DIAGNOSIS — M797 Fibromyalgia: Secondary | ICD-10-CM | POA: Diagnosis not present

## 2019-12-30 DIAGNOSIS — G4700 Insomnia, unspecified: Secondary | ICD-10-CM | POA: Diagnosis not present

## 2019-12-30 DIAGNOSIS — M87052 Idiopathic aseptic necrosis of left femur: Secondary | ICD-10-CM | POA: Diagnosis not present

## 2019-12-30 DIAGNOSIS — E785 Hyperlipidemia, unspecified: Secondary | ICD-10-CM | POA: Diagnosis not present

## 2019-12-30 DIAGNOSIS — K219 Gastro-esophageal reflux disease without esophagitis: Secondary | ICD-10-CM | POA: Diagnosis not present

## 2019-12-30 DIAGNOSIS — K589 Irritable bowel syndrome without diarrhea: Secondary | ICD-10-CM | POA: Diagnosis not present

## 2019-12-30 DIAGNOSIS — M222X2 Patellofemoral disorders, left knee: Secondary | ICD-10-CM | POA: Diagnosis not present

## 2019-12-30 DIAGNOSIS — Z96641 Presence of right artificial hip joint: Secondary | ICD-10-CM | POA: Diagnosis not present

## 2019-12-30 DIAGNOSIS — M222X1 Patellofemoral disorders, right knee: Secondary | ICD-10-CM | POA: Diagnosis not present

## 2019-12-30 DIAGNOSIS — Z471 Aftercare following joint replacement surgery: Secondary | ICD-10-CM | POA: Diagnosis not present

## 2019-12-30 DIAGNOSIS — E538 Deficiency of other specified B group vitamins: Secondary | ICD-10-CM | POA: Diagnosis not present

## 2019-12-30 DIAGNOSIS — G43909 Migraine, unspecified, not intractable, without status migrainosus: Secondary | ICD-10-CM | POA: Diagnosis not present

## 2019-12-30 NOTE — Telephone Encounter (Signed)
Sent order and  notes to [facility] for her HHPT

## 2019-12-30 NOTE — Telephone Encounter (Signed)
Patient called advised she is going back to [place] and asked if Dr Ninfa Linden would write an order for HHPT with [facility]. The number to contact patient is [phone]

## 2020-01-03 ENCOUNTER — Telehealth: Payer: Self-pay | Admitting: Orthopaedic Surgery

## 2020-01-03 DIAGNOSIS — Z96642 Presence of left artificial hip joint: Secondary | ICD-10-CM | POA: Diagnosis not present

## 2020-01-03 DIAGNOSIS — Z471 Aftercare following joint replacement surgery: Secondary | ICD-10-CM | POA: Diagnosis not present

## 2020-01-03 DIAGNOSIS — Z8739 Personal history of other diseases of the musculoskeletal system and connective tissue: Secondary | ICD-10-CM | POA: Diagnosis not present

## 2020-01-03 DIAGNOSIS — O019 Hydatidiform mole, unspecified: Secondary | ICD-10-CM | POA: Diagnosis not present

## 2020-01-03 DIAGNOSIS — Z8542 Personal history of malignant neoplasm of other parts of uterus: Secondary | ICD-10-CM | POA: Diagnosis not present

## 2020-01-03 DIAGNOSIS — Z79891 Long term (current) use of opiate analgesic: Secondary | ICD-10-CM | POA: Diagnosis not present

## 2020-01-03 NOTE — Telephone Encounter (Signed)
Verbal order left on VM  

## 2020-01-03 NOTE — Telephone Encounter (Signed)
Hilda Blades from [facility] called.   Requesting verbal orders for PT 2wk1  Call back: [phone] Faythe Ghee to leave a voicemail

## 2020-01-06 ENCOUNTER — Encounter: Payer: Self-pay | Admitting: Orthopaedic Surgery

## 2020-01-06 ENCOUNTER — Ambulatory Visit (INDEPENDENT_AMBULATORY_CARE_PROVIDER_SITE_OTHER): Payer: Medicaid Other | Admitting: Orthopaedic Surgery

## 2020-01-06 DIAGNOSIS — M87052 Idiopathic aseptic necrosis of left femur: Secondary | ICD-10-CM

## 2020-01-06 DIAGNOSIS — Z96641 Presence of right artificial hip joint: Secondary | ICD-10-CM

## 2020-01-06 NOTE — Progress Notes (Signed)
The patient is 2 weeks tomorrow status post a right total hip arthroplasty due to avascular process of her right hip.  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  She says her left hip is hurting her some.  She is doing well overall.  She is not taking any narcotics.  She is taking Robaxin.  She is on 1 baby aspirin twice a day.  On exam her ligaments are equal.  Her calves are soft.  I did remove the old Steri-Strips in place new Steri-Strips at her right hip.  There was a seroma and I drained about 30 cc of fluid from around her hip which gave her comfort.  At this point she can stop her aspirin since she is so mobile.  We'll see her back in 4 weeks to see how she is doing overall.  If there is any issues before then she knows to let us know.

## 2020-01-08 DIAGNOSIS — Z471 Aftercare following joint replacement surgery: Secondary | ICD-10-CM | POA: Diagnosis not present

## 2020-01-08 DIAGNOSIS — Z8739 Personal history of other diseases of the musculoskeletal system and connective tissue: Secondary | ICD-10-CM | POA: Diagnosis not present

## 2020-01-08 DIAGNOSIS — Z96642 Presence of left artificial hip joint: Secondary | ICD-10-CM | POA: Diagnosis not present

## 2020-01-08 DIAGNOSIS — Z79891 Long term (current) use of opiate analgesic: Secondary | ICD-10-CM | POA: Diagnosis not present

## 2020-01-08 DIAGNOSIS — Z8542 Personal history of malignant neoplasm of other parts of uterus: Secondary | ICD-10-CM | POA: Diagnosis not present

## 2020-01-12 ENCOUNTER — Ambulatory Visit (INDEPENDENT_AMBULATORY_CARE_PROVIDER_SITE_OTHER): Payer: Medicaid Other | Admitting: Orthopaedic Surgery

## 2020-01-12 ENCOUNTER — Other Ambulatory Visit: Payer: Self-pay

## 2020-01-12 ENCOUNTER — Encounter: Payer: Self-pay | Admitting: Orthopaedic Surgery

## 2020-01-12 DIAGNOSIS — Z96641 Presence of right artificial hip joint: Secondary | ICD-10-CM

## 2020-01-12 DIAGNOSIS — Z419 Encounter for procedure for purposes other than remedying health state, unspecified: Secondary | ICD-10-CM | POA: Diagnosis not present

## 2020-01-12 NOTE — Progress Notes (Signed)
The patient come in today 3 weeks out from a right total hip arthroplasty.  At her 2-week first postoperative visit I did drain the seroma from around the right hip area.  It has reaccumulated and she would like to have this aspirated today.  She does ambulate with a cane but does not use it at home.  She reports overall she is doing great.  I was able to aspirate 40 to 50 cc of fluid off of the tissues around her hip.  There is no evidence of infection.  Her incision looks great.  She is a very thin individual so this gave her immediate relief.  She will keep her follow-up appointment with [place] in 3 weeks but no x-rays are needed.  If she does have a reaccumulation of the seroma next week we can always work her in for an aspiration again.

## 2020-01-18 ENCOUNTER — Other Ambulatory Visit: Payer: Self-pay | Admitting: Cardiology

## 2020-01-18 DIAGNOSIS — E782 Mixed hyperlipidemia: Secondary | ICD-10-CM | POA: Diagnosis not present

## 2020-01-19 ENCOUNTER — Ambulatory Visit (INDEPENDENT_AMBULATORY_CARE_PROVIDER_SITE_OTHER): Payer: Medicaid Other | Admitting: Cardiology

## 2020-01-19 ENCOUNTER — Encounter: Payer: Self-pay | Admitting: Cardiology

## 2020-01-19 ENCOUNTER — Other Ambulatory Visit: Payer: Self-pay

## 2020-01-19 ENCOUNTER — Telehealth: Payer: Self-pay

## 2020-01-19 VITALS — BP 122/64 | HR 103 | Ht 68.0 in | Wt 146.1 lb

## 2020-01-19 DIAGNOSIS — R55 Syncope and collapse: Secondary | ICD-10-CM

## 2020-01-19 DIAGNOSIS — I951 Orthostatic hypotension: Secondary | ICD-10-CM | POA: Diagnosis not present

## 2020-01-19 LAB — LIPID PANEL
Chol/HDL Ratio: 6.9 ratio — ABNORMAL HIGH (ref 0.0–4.4)
Cholesterol, Total: 395 mg/dL — ABNORMAL HIGH (ref 100–199)
HDL: 57 mg/dL (ref >39)
LDL Chol Calc (NIH): 307 mg/dL — ABNORMAL HIGH (ref 0–99)
Triglycerides: 155 mg/dL — ABNORMAL HIGH (ref 0–149)
VLDL Cholesterol Cal: 31 mg/dL (ref 5–40)

## 2020-01-19 LAB — HEPATIC FUNCTION PANEL
ALT: 10 IU/L (ref 0–32)
AST: 13 IU/L (ref 0–40)
Albumin: 4.2 g/dL (ref 3.8–4.8)
Alkaline Phosphatase: 95 IU/L (ref 48–121)
Bilirubin Total: 0.5 mg/dL (ref 0.0–1.2)
Bilirubin, Direct: 0.1 mg/dL (ref 0.00–0.40)
Total Protein: 6.6 g/dL (ref 6.0–8.5)

## 2020-01-19 MED ORDER — ROSUVASTATIN CALCIUM 5 MG PO TABS
5.0000 mg | ORAL_TABLET | Freq: Every day | ORAL | 3 refills | Status: DC
Start: 2020-01-19 — End: 2020-05-19

## 2020-01-19 NOTE — Telephone Encounter (Signed)
Spoke to patient just now and let her know that Dr. Harriet Masson looked over her lab work that was completed on 01/18/20. She would like for the patient to start on crestor 5 mg daily. She verbalizes understanding and thanks me for the call back.    Encouraged patient to call back with any questions or concerns.

## 2020-01-19 NOTE — Patient Instructions (Signed)

## 2020-01-19 NOTE — Progress Notes (Signed)
Cardiology Office Note:    Date:  01/19/2020   ID:  Erica Welch, DOB [birth year], MRN [id number]  PCP:  Madelaine Bhat., MD  Cardiologist:  Berniece Salines, DO  Electrophysiologist:  None   Referring MD: Madelaine Bhat., MD   Follow up visit  History of Present Illness:    Erica Welch is a 34 y.o. 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  At her last visit I referred her to EP given her multiple presyncope episodes.  She was seen by EP and her syncope was thought to be neurally mediated.  She was asked to continue Florinef and avoid caffeine and alcohol.  Today she tells me that she has been doing well.  She has had some episodes of presyncope but has not passed out.  She has not been taking the Florinef she says she is concerned about that giving history of avascular necrosis.  Past Medical History:  Diagnosis Date  . AV nodal tachycardia ([place]) 2017  . Cancer ([place])    uterine   . Cubital tunnel syndrome 2017  . Dyspareunia due to medical condition in female   . Dysrhythmia    SVT  . Endometriosis   . Fibromyalgia   . GERD (gastroesophageal reflux disease)   . History of kidney stones   . Hyperlipidemia   . Hypermobility syndrome   . IBS (irritable bowel syndrome)   . Insomnia   . Memory loss   . Migraine   . Occipital neuralgia 2017  . Patellofemoral arthralgia of both knees   . Pneumonia    as a baby  . Right flank pain 2017  . Rotator cuff tendinitis 2017  . Vitamin B12 deficiency   . Vitamin D deficiency     Past Surgical History:  Procedure Laterality Date  . ABDOMINAL SURGERY  2007,2010,2012,2014  . arthroscopy right knee    . cervical dics repacement    . CESAREAN SECTION  03/19/2006  . CESAREAN SECTION  12/09/2008  . NASAL SEPTUM SURGERY  2013  . PELVIC LAPAROSCOPY  2012  . PORTA CATH INSERTION     and removal  . TOTAL HIP ARTHROPLASTY Right 12/24/2019    Procedure: RIGHT TOTAL HIP ARTHROPLASTY ANTERIOR APPROACH;  Surgeon: [name], MD;  Location: [facility];  Service: Orthopedics;  Laterality: Right;  . TUBAL LIGATION N/A    and untied  . TYMPANOPLASTY    . WISDOM TOOTH EXTRACTION      Current Medications: Current Meds  Medication Sig  . acetaminophen (TYLENOL) 500 MG tablet Take 500-1,000 mg by mouth every 6 (six) hours as needed for headache.  . ibuprofen (ADVIL) 200 MG tablet Take 400-800 mg by mouth every 6 (six) hours as needed for headache or moderate pain.  . medroxyPROGESTERone (DEPO-PROVERA) 150 MG/ML injection Inject 150 mg into the muscle every 3 (three) months.   . methocarbamol (ROBAXIN) 500 MG tablet Take 1 tablet (500 mg total) by mouth every 6 (six) hours as needed for muscle spasms.  . ondansetron (ZOFRAN) 8 MG tablet Take 8 mg by mouth every 8 (eight) hours as needed for nausea or vomiting.   Marland Kitchen oxyCODONE (OXY IR/ROXICODONE) 5 MG immediate release tablet Take 1-2 tablets (5-10 mg total) by mouth every 4 (four) hours as needed for moderate pain (pain score 4-6).     Allergies:   Latex, Strawberry extract, and Adhesive [tape]   Social History  Socioeconomic History  . Marital status: Married    Spouse name: Not on file  . Number of children: Not on file  . Years of education: Not on file  . Highest education level: Not on file  Occupational History  . Not on file  Tobacco Use  . Smoking status: Former Smoker    Years: 10.00    Quit date: 2016    Years since quitting: 5.6  . Smokeless tobacco: Never Used  . Tobacco comment: off and on   Vaping Use  . Vaping Use: Never used  Substance and Sexual Activity  . Alcohol use: Not Currently  . Drug use: Never  . Sexual activity: Yes    Birth control/protection: Injection    Comment: Depo every 3 months  Other Topics Concern  . Not on file  Social History Narrative  . Not on file   Social Determinants of Health   Financial Resource Strain:   .  Difficulty of Paying Living Expenses: Not on file  Food Insecurity:   . Worried About Charity fundraiser in the Last Year: Not on file  . Ran Out of Food in the Last Year: Not on file  Transportation Needs:   . Lack of Transportation (Medical): Not on file  . Lack of Transportation (Non-Medical): Not on file  Physical Activity:   . Days of Exercise per Week: Not on file  . Minutes of Exercise per Session: Not on file  Stress:   . Feeling of Stress : Not on file  Social Connections:   . Frequency of Communication with Friends and Family: Not on file  . Frequency of Social Gatherings with Friends and Family: Not on file  . Attends Religious Services: Not on file  . Active Member of Clubs or Organizations: Not on file  . Attends Archivist Meetings: Not on file  . Marital Status: Not on file     Family History: The patient's family history is not on file.  ROS:   Review of Systems  Constitution: Negative for decreased appetite, fever and weight gain.  HENT: Negative for congestion, ear discharge, hoarse voice and sore throat.   Eyes: Negative for discharge, redness, vision loss in right eye and visual halos.  Cardiovascular: Negative for chest pain, dyspnea on exertion, leg swelling, orthopnea and palpitations.  Respiratory: Negative for cough, hemoptysis, shortness of breath and snoring.   Endocrine: Negative for heat intolerance and polyphagia.  Hematologic/Lymphatic: Negative for bleeding problem. Does not bruise/bleed easily.  Skin: Negative for flushing, nail changes, rash and suspicious lesions.  Musculoskeletal: Negative for arthritis, joint pain, muscle cramps, myalgias, neck pain and stiffness.  Gastrointestinal: Negative for abdominal pain, bowel incontinence, diarrhea and excessive appetite.  Genitourinary: Negative for decreased libido, genital sores and incomplete emptying.  Neurological: Negative for brief paralysis, focal weakness, headaches and loss of  balance.  Psychiatric/Behavioral: Negative for altered mental status, depression and suicidal ideas.  Allergic/Immunologic: Negative for HIV exposure and persistent infections.    EKGs/Labs/Other Studies Reviewed:    The following studies were reviewed today:   EKG:  The ekg ordered today demonstrates sinus tachycardia, heart rate 103 bpm.  Zio monitor The patient wore the monitor for 14 days starting Oct 04, 2019. Indication: Palpitations  The minimum heart rate was 45 bpm, maximum heart rate was 158 bpm, and average heart rate was 79 bpm. Predominant underlying rhythm was Sinus Rhythm.  Premature atrial complexes were were rare (<1.0%). Premature Ventricular complexes were rare (<1.0%),  No ventricular tachycardia, no pauses, No AV block, no supraventricular and no atrial fibrillation present. 1 dairy event noted associated with sinus rhythm.    Conclusion: Unremarkable/normal study.   Echo IMPRESSIONS 10/26/2019 1. Left ventricular ejection fraction, by estimation, is 60 to 65%. The left ventricle has normal function. The left ventricle has no regional wall motion abnormalities. Left ventricular diastolic parameters were  normal.  2. Right ventricular systolic function is normal. The right ventricular size is normal.  3. The mitral valve is normal in structure. Trivial mitral valve regurgitation. No evidence of mitral stenosis.  4. The aortic valve is normal in structure. Aortic valve regurgitation is not visualized. No aortic stenosis is present.  5. The inferior vena cava is normal in size with greater than 50% respiratory variability, suggesting right atrial pressure of 3 mmHg.   Recent Labs: 12/25/2019: BUN 9; Creatinine, Ser 0.59; Hemoglobin 12.0; Platelets 208; Potassium 3.8; Sodium 131  Recent Lipid Panel No results found for: CHOL, TRIG, HDL, CHOLHDL, VLDL, LDLCALC, LDLDIRECT  Physical Exam:    VS:  BP 122/64   Pulse (!) 103   Ht 5\' 8"  (1.727 m)   Wt  146 lb 1.3 oz (66.3 kg)   SpO2 98%   BMI 22.21 kg/m     Wt Readings from Last 3 Encounters:  01/19/20 146 lb 1.3 oz (66.3 kg)  12/24/19 145 lb 4.5 oz (65.9 kg)  12/21/19 145 lb 3 oz (65.9 kg)     GEN: Well nourished, well developed in no acute distress HEENT: Normal NECK: No JVD; No carotid bruits LYMPHATICS: No lymphadenopathy CARDIAC: S1S2 noted,RRR, no murmurs, rubs, gallops RESPIRATORY:  Clear to auscultation without rales, wheezing or rhonchi  ABDOMEN: Soft, non-tender, non-distended, +bowel sounds, no guarding. EXTREMITIES: No edema, No cyanosis, no clubbing MUSCULOSKELETAL:  No deformity  SKIN: Warm and dry NEUROLOGIC:  Alert and oriented x 3, non-focal PSYCHIATRIC:  Normal affect, good insight  ASSESSMENT:    1. Orthostatic hypotension   2. Near syncope    PLAN:    She still has had some presyncope episode but had not passed out.  Palpitations has improved.  She is recovering from her recent surgery and has done well.  She is hesitant to start of Florinef. She is also going to speak with orthopedic surgeon to see if she is okay to take this medication.  She has increased her salt intake as well as cut back on caffeine and increase water.  Her blood pressure today is acceptable and she tells me that she is seeing her systolics in the 570V at home.  She is happy about her findings   The patient is in agreement with the above plan. The patient left the office in stable condition.  The patient will follow up in 6 months or sooner if needed.   Medication Adjustments/Labs and Tests Ordered: Current medicines are reviewed at length with the patient today.  Concerns regarding medicines are outlined above.  Orders Placed This Encounter  Procedures  . EKG 12-Lead   No orders of the defined types were placed in this encounter.   Patient Instructions  Medication Instructions:  Your physician recommends that you continue on your current medications as directed. Please  refer to the Current Medication list given to you today.  *If you need a refill on your cardiac medications before your next appointment, please call your pharmacy*   Lab Work: None If you have labs (blood work) drawn today and your tests are completely normal,  you will receive your results only by: Marland Kitchen MyChart Message (if you have MyChart) OR . A paper copy in the mail If you have any lab test that is abnormal or we need to change your treatment, we will call you to review the results.   Testing/Procedures: None   Follow-Up: At [facility], you and your health needs are our priority.  As part of our continuing mission to provide you with exceptional heart care, we have created designated Provider Care Teams.  These Care Teams include your primary Cardiologist (physician) and Advanced Practice Providers (APPs -  Physician Assistants and Nurse Practitioners) who all work together to provide you with the care you need, when you need it.  We recommend signing up for the patient portal called "MyChart".  Sign up information is provided on this After Visit Summary.  MyChart is used to connect with patients for Virtual Visits (Telemedicine).  Patients are able to view lab/test results, encounter notes, upcoming appointments, etc.  Non-urgent messages can be sent to your provider as well.   To learn more about what you can do with MyChart, go to [email or web address].    Your next appointment:   6 month(s)  The format for your next appointment:   In Person  Provider:   Berniece Salines, DO   Other Instructions      Adopting a Healthy Lifestyle.  Know what a healthy weight is for you (roughly BMI <25) and aim to maintain this   Aim for 7+ servings of fruits and vegetables daily   65-80+ fluid ounces of water or unsweet tea for healthy kidneys   Limit to max 1 drink of alcohol per day; avoid smoking/tobacco   Limit animal fats in diet for cholesterol and heart health - choose  grass fed whenever available   Avoid highly processed foods, and foods high in saturated/trans fats   Aim for low stress - take time to unwind and care for your mental health   Aim for 150 min of moderate intensity exercise weekly for heart health, and weights twice weekly for bone health   Aim for 7-9 hours of sleep daily   When it comes to diets, agreement about the perfect plan isnt easy to find, even among the experts. Experts at the [employer] developed an idea known as the Healthy Eating Plate. Just imagine a plate divided into logical, healthy portions.   The emphasis is on diet quality:   Load up on vegetables and fruits - one-half of your plate: Aim for color and variety, and remember that potatoes dont count.   Go for whole grains - one-quarter of your plate: Whole wheat, barley, wheat berries, quinoa, oats, brown rice, and foods made with them. If you want pasta, go with whole wheat pasta.   Protein power - one-quarter of your plate: Fish, chicken, beans, and nuts are all healthy, versatile protein sources. Limit red meat.   The diet, however, does go beyond the plate, offering a few other suggestions.   Use healthy plant oils, such as olive, canola, soy, corn, sunflower and peanut. Check the labels, and avoid partially hydrogenated oil, which have unhealthy trans fats.   If youre thirsty, drink water. Coffee and tea are good in moderation, but skip sugary drinks and limit milk and dairy products to one or two daily servings.   The type of carbohydrate in the diet is more important than the amount. Some sources of carbohydrates, such as vegetables, fruits, whole  grains, and beans-are healthier than others.   Finally, stay active  Signed, Berniece Salines, DO  01/19/2020 2:01 PM    [facility]

## 2020-02-03 ENCOUNTER — Ambulatory Visit (INDEPENDENT_AMBULATORY_CARE_PROVIDER_SITE_OTHER): Payer: Medicaid Other | Admitting: Orthopaedic Surgery

## 2020-02-03 ENCOUNTER — Encounter: Payer: Self-pay | Admitting: Orthopaedic Surgery

## 2020-02-03 DIAGNOSIS — Z96641 Presence of right artificial hip joint: Secondary | ICD-10-CM

## 2020-02-03 NOTE — Progress Notes (Signed)
The patient is a 34 year old female who is now 6 weeks status post a right total hip arthroplasty secondary to avascular necrosis.  There is a nidus of AVN her left hip and so far she is asymptomatic with the left hip.  She is walking without assistive device and does report some stiffness and numbness around the hip but otherwise she feels like she is making progress and doing well.  On exam the right hip moves much more fluidly.  It is a little bit stiff that is to be expected in 6 weeks postoperative.  The left hip is asymptomatic and has full motion.  Should continue increase her activities as comfort allows.  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  If she gets close to that date needs it extended she will let us know.  I will see her back in 6 months with a low AP pelvis and lateral of her right and left hips.  She does report some left knee issues and I told her if this becomes more problematic to let us know and I can always see her sooner for her left knee or even her hips if she is having issues.

## 2020-02-11 DIAGNOSIS — Z419 Encounter for procedure for purposes other than remedying health state, unspecified: Secondary | ICD-10-CM | POA: Diagnosis not present

## 2020-03-13 DIAGNOSIS — Z419 Encounter for procedure for purposes other than remedying health state, unspecified: Secondary | ICD-10-CM | POA: Diagnosis not present

## 2020-03-30 ENCOUNTER — Emergency Department (INDEPENDENT_AMBULATORY_CARE_PROVIDER_SITE_OTHER)
Admission: RE | Admit: 2020-03-30 | Discharge: 2020-03-30 | Disposition: A | Discharge status: Home / Self Care | Payer: Medicaid Other | Source: Ambulatory Visit

## 2020-03-30 ENCOUNTER — Other Ambulatory Visit: Payer: Self-pay

## 2020-03-30 VITALS — BP 126/84 | HR 70 | Temp 99.2°F | Ht 68.0 in | Wt 150.0 lb

## 2020-03-30 DIAGNOSIS — R55 Syncope and collapse: Secondary | ICD-10-CM

## 2020-03-30 DIAGNOSIS — R102 Pelvic and perineal pain: Secondary | ICD-10-CM

## 2020-03-30 LAB — POCT URINALYSIS DIP (MANUAL ENTRY)
Bilirubin, UA: NEGATIVE
Blood, UA: NEGATIVE
Glucose, UA: NEGATIVE mg/dL
Ketones, POC UA: NEGATIVE mg/dL
Leukocytes, UA: NEGATIVE
Nitrite, UA: NEGATIVE
Protein Ur, POC: NEGATIVE mg/dL
Spec Grav, UA: 1.025 (ref 1.010–1.025)
Urobilinogen, UA: 0.2 E.U./dL
pH, UA: 6 (ref 5.0–8.0)

## 2020-03-30 LAB — POCT CBC W AUTO DIFF (K'VILLE URGENT CARE)

## 2020-03-30 LAB — POCT URINE PREGNANCY: Preg Test, Ur: NEGATIVE

## 2020-03-30 NOTE — Discharge Instructions (Signed)
  Be sure to stay well hydrated and get at least 8 hours of sleep.  Monitor your blood pressure at home. If you have to get up in the middle of the night to urinate, be sure to sit on the edge of your bed for 1-2 minutes to allow your blood pressure to regulate before getting up to walk to the bathroom, this will help decrease risk of sudden changes of blood pressure that can contribute to passing out.  Do NOT drive until you are cleared to do so by your primary care provider or a specialist such as cardiology or neurology.     Call 911 or have someone drive you to the hospital if symptoms significantly worsening including severe headache, passing out again, change in vision, chest pain or other new concerning symptoms develop.

## 2020-03-30 NOTE — ED Triage Notes (Signed)
Patient presents to Urgent Care with complaints of syncopal episode since this morning. Patient reports she felt herself start to have a hot flash and could feel herself getting light headed and did not make it back to the bed before she fell, hitting her head on her husband's hip bone on the way down. Pt thinks she was passed out for about 1 minute, has had dx of orthostatic hypotension in the past. Pt has a headache at this time, is A&Ox4, ambulatory w/ steady gait, denies dizziness but still feels mildly lightheaded (thinks it's due to facial pain), some nausesa throughout the day. Went to bed at midnight, got up at 1 to have a BM could feel herself going out on the toilet.

## 2020-03-30 NOTE — ED Provider Notes (Signed)
[facility]    CSN: [id number] Arrival date & time: 03/30/20  1652      History   Chief Complaint Chief Complaint  Patient presents with  . Appointment    5:00  . Loss of Consciousness    HPI Erica Welch is a 34 y.o. female.   HPI Erica Welch is a 34 y.o. female presenting to UC with c/o syncopal episode this morning around 1AM after she woke to go to the bathroom.  Pt reports having menstrual cramps so she got up to use the restroom but when she sat on the toilet, she felt dizzy and lightheaded.  She hurried back to bed but passed out just before getting into bed, hitting her face on her husband's hip.  Hx of orthostatic hypotension in the past. Pt felt well enough to drive herself to work and worked all day but wanted to be evaluated to make sure she was okay since it has been a while since her last syncopal episode.  Mild facial pain where her nose hit her husband's hip. Denies nose bleed. Denies HA, dizziness, change in vision. Denies chest pain or palpitations. States she has been evaluated by a cardiologist in the past and has been advised she has mild hypotension and has had mild anemia in the past.  She has not seen her PCP in over a year.  Pt notes she is currently on her period and wonders if that contributed to her symptoms this morning. Cramping is normal for her during her cycle.    Past Medical History:  Diagnosis Date  . 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 Cancer ([place])    uterine   . Cubital tunnel syndrome 2017  . Dyspareunia due to medical condition in female   . Dysrhythmia    SVT  . Endometriosis   . Fibromyalgia   . GERD (gastroesophageal reflux disease)   . History of kidney stones   . Hyperlipidemia   . Hypermobility syndrome   . IBS (irritable bowel syndrome)   . Insomnia   . Memory loss   . Migraine   . Occipital neuralgia 2017  . Patellofemoral arthralgia of both knees   . Pneumonia    as a baby  . Right flank pain 2017  .  Rotator cuff tendinitis 2017  . Vitamin B12 deficiency   . Vitamin D deficiency     Patient Active Problem List   Diagnosis Date Noted  . 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 Avascular necrosis of hip, left ([place]) 10/04/2019  . Avascular necrosis of hip, right ([place]) 10/04/2019  . Near syncope 10/04/2019  . Orthostatic hypotension 10/04/2019  . 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 Missed abortion 01/28/2019  . History of miscarriage, currently pregnant, first trimester 09/24/2018  . History of 2 cesarean sections 09/01/2018  . 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 Preop cardiovascular exam 01/21/2018  . Left foot pain 06/19/2017  . Fibromyalgia 05/20/2017  . Right knee injury, subsequent encounter 05/20/2017  . Right ankle injury, initial encounter 05/20/2017  . Acute sinusitis, unspecified 08/10/2015  . Chronic pain of left knee 07/07/2015  . Anxiety 07/05/2015  . Arthritis, multiple joint involvement 07/05/2015  . Asthma 07/05/2015  . AV nodal tachycardia ([place]) 07/05/2015  . Endometriosis 07/05/2015  . GERD without esophagitis 07/05/2015  . Hyperlipidemia, unspecified 07/05/2015  . Irritable bowel syndrome without diarrhea 07/05/2015  . Lung nodule 07/05/2015  . Fatty liver 09/07/2014  .  Kidney stone 10/29/2012  . 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 Chest pain 05/20/2011  . Palpitations 05/20/2011  . SOB (shortness of breath) 05/20/2011    Past Surgical History:  Procedure Laterality Date  . ABDOMINAL SURGERY  2007,2010,2012,2014  . arthroscopy right knee    . cervical dics repacement    . CESAREAN SECTION  03/19/2006  . CESAREAN SECTION  12/09/2008  . NASAL SEPTUM SURGERY  2013  . PELVIC LAPAROSCOPY  2012  . PORTA CATH INSERTION     and removal  . TOTAL HIP ARTHROPLASTY Right 12/24/2019   Procedure: RIGHT TOTAL HIP ARTHROPLASTY ANTERIOR APPROACH;  Surgeon: [name], MD;  Location: [facility];  Service:  Orthopedics;  Laterality: Right;  . TUBAL LIGATION N/A    and untied  . TYMPANOPLASTY    . WISDOM TOOTH EXTRACTION      OB History   No obstetric history on file.      Home Medications    Prior to Admission medications   Medication Sig Start Date End Date Taking? Authorizing Provider  acetaminophen (TYLENOL) 500 MG tablet Take 500-1,000 mg by mouth every 6 (six) hours as needed for headache.    [provider]  aspirin 81 MG chewable tablet Chew 1 tablet (81 mg total) by mouth 2 (two) times daily. 12/25/19   [name], MD  ibuprofen (ADVIL) 200 MG tablet Take 400-800 mg by mouth every 6 (six) hours as needed for headache or moderate pain.    [provider]  medroxyPROGESTERone (DEPO-PROVERA) 150 MG/ML injection Inject 150 mg into the muscle every 3 (three) months.  03/08/19   [provider]  methocarbamol (ROBAXIN) 500 MG tablet Take 1 tablet (500 mg total) by mouth every 6 (six) hours as needed for muscle spasms. 12/25/19   [name], MD  ondansetron (ZOFRAN) 8 MG tablet Take 8 mg by mouth every 8 (eight) hours as needed for nausea or vomiting.  06/13/19   [provider]  oxyCODONE (OXY IR/ROXICODONE) 5 MG immediate release tablet Take 1-2 tablets (5-10 mg total) by mouth every 4 (four) hours as needed for moderate pain (pain score 4-6). 12/25/19   [name], MD  rosuvastatin (CRESTOR) 5 MG tablet Take 1 tablet (5 mg total) by mouth daily. 01/19/20   Tobb, Godfrey Pick, DO    Family History Family History  Problem Relation Age of Onset  . Thyroid disease Mother   . Hypertension Mother   . Cancer Mother   . Hyperlipidemia Father     Social History Social History   Tobacco Use  . Smoking status: Former Smoker    Years: 10.00    Types: Cigarettes  . Smokeless tobacco: Never Used  . Tobacco comment: off and on   Vaping Use  . Vaping Use: Never used  Substance Use Topics  . Alcohol use: Yes  . Drug use:  Never     Allergies   Latex, Strawberry extract, and Adhesive [tape]   Review of Systems Review of Systems  Constitutional: Negative for chills and fever.  HENT: Negative for congestion, ear pain, sore throat, trouble swallowing and voice change.   Respiratory: Negative for cough and shortness of breath.   Cardiovascular: Negative for chest pain and palpitations.  Gastrointestinal: Negative for abdominal pain, diarrhea, nausea and vomiting.  Musculoskeletal: Negative for arthralgias, back pain and myalgias.  Skin: Negative for rash.  Neurological: Positive for syncope and light-headedness. Negative for weakness and headaches.  All other systems reviewed  and are negative.    Physical Exam Triage Vital Signs ED Triage Vitals  Enc Vitals Group     BP 03/30/20 1722 121/82     Pulse Rate 03/30/20 1722 70     Resp --      Temp 03/30/20 1722 99.2 F (37.3 C)     Temp Source 03/30/20 1722 Oral     SpO2 03/30/20 1722 97 %     Weight 03/30/20 1717 150 lb (68 kg)     Height 03/30/20 1717 5\' 8"  (1.727 m)     Head Circumference --      Peak Flow --      Pain Score 03/30/20 1717 3     Pain Loc --      Pain Edu? --      Excl. in [place]? --    Orthostatic VS for the past 24 hrs:  BP- Lying Pulse- Lying BP- Sitting Pulse- Sitting BP- Standing at 0 minutes Pulse- Standing at 0 minutes  03/30/20 1737 128/78 63 139/87 72 (!) 151/107 89    Updated Vital Signs BP 126/84 (BP Location: Left Arm)   Pulse 70   Temp 99.2 F (37.3 C) (Oral)   Ht 5\' 8"  (1.727 m)   Wt 150 lb (68 kg)   LMP 03/28/2020 (Exact Date)   SpO2 97%   BMI 22.81 kg/m   Visual Acuity Right Eye Distance:   Left Eye Distance:   Bilateral Distance:    Right Eye Near:   Left Eye Near:    Bilateral Near:     Physical Exam Vitals and nursing note reviewed.  Constitutional:      General: She is not in acute distress.    Appearance: Normal appearance. She is well-developed. She is not ill-appearing, toxic-appearing  or diaphoretic.     Comments: Pt sitting in exam chair, NAD, listing to podcast and scrolling on ipad throughout exam.   HENT:     Head: Normocephalic and atraumatic.     Right Ear: Tympanic membrane and ear canal normal.     Left Ear: Tympanic membrane and ear canal normal.     Nose: Nose normal.     Mouth/Throat:     Mouth: Mucous membranes are moist.     Pharynx: Oropharynx is clear.  Eyes:     Extraocular Movements: Extraocular movements intact.     Conjunctiva/sclera: Conjunctivae normal.     Pupils: Pupils are equal, round, and reactive to light.  Cardiovascular:     Rate and Rhythm: Normal rate and regular rhythm.  Pulmonary:     Effort: Pulmonary effort is normal. No respiratory distress.     Breath sounds: Normal breath sounds. No stridor. No wheezing, rhonchi or rales.  Abdominal:     General: There is no distension.     Palpations: Abdomen is soft.     Tenderness: There is no abdominal tenderness. There is no right CVA tenderness or left CVA tenderness.  Musculoskeletal:        General: Normal range of motion.     Cervical back: Normal range of motion and neck supple. No rigidity or tenderness.  Skin:    General: Skin is warm and dry.     Capillary Refill: Capillary refill takes less than 2 seconds.  Neurological:     General: No focal deficit present.     Mental Status: She is alert and oriented to person, place, and time.     Cranial Nerves: No cranial nerve deficit.  Sensory: No sensory deficit.     Motor: No weakness.     Coordination: Coordination normal.     Gait: Gait normal.  Psychiatric:        Mood and Affect: Mood normal.        Behavior: Behavior normal.      UC Treatments / Results  Labs (all labs ordered are listed, but only abnormal results are displayed) Labs Reviewed  COMPLETE METABOLIC PANEL WITH GFR  POCT CBC W AUTO DIFF ([facility])  POCT URINE PREGNANCY  POCT URINALYSIS DIP (MANUAL ENTRY)    EKG   Radiology No  results found.  Procedures Procedures (including critical care time)  Medications Ordered in UC Medications - No data to display  Initial Impression / Assessment and Plan / UC Course  I have reviewed the triage vital signs and the nursing notes.  Pertinent labs & imaging results that were available during my care of the patient were reviewed by me and considered in my medical decision making (see chart for details).     Normal neuro exam Orthostatic vitals- hypertensive when standing but denies HA, dizziness or chest pain. CBC and UA: WNL CMP pending Encouraged f/u with PCP Advised not to drive until cleared by PCP or specialist. Husband came to pick pt up. Discussed symptoms that warrant emergent care in the ED. AVS given  Final Clinical Impressions(s) / UC Diagnoses   Final diagnoses:  Syncope and collapse  Pelvic pain in female     Discharge Instructions      Be sure to stay well hydrated and get at least 8 hours of sleep.  Monitor your blood pressure at home. If you have to get up in the middle of the night to urinate, be sure to sit on the edge of your bed for 1-2 minutes to allow your blood pressure to regulate before getting up to walk to the bathroom, this will help decrease risk of sudden changes of blood pressure that can contribute to passing out.  Do NOT drive until you are cleared to do so by your primary care provider or a specialist such as cardiology or neurology.     Call 911 or have someone drive you to the hospital if symptoms significantly worsening including severe headache, passing out again, change in vision, chest pain or other new concerning symptoms develop.      ED Prescriptions    None     PDMP not reviewed this encounter.   Noe Gens, PA-C 03/30/20 2001

## 2020-03-31 ENCOUNTER — Encounter: Payer: Self-pay | Admitting: Orthopaedic Surgery

## 2020-03-31 ENCOUNTER — Emergency Department (HOSPITAL_BASED_OUTPATIENT_CLINIC_OR_DEPARTMENT_OTHER): Payer: Medicaid Other

## 2020-03-31 ENCOUNTER — Encounter (HOSPITAL_BASED_OUTPATIENT_CLINIC_OR_DEPARTMENT_OTHER): Payer: Self-pay | Admitting: *Deleted

## 2020-03-31 ENCOUNTER — Emergency Department (HOSPITAL_BASED_OUTPATIENT_CLINIC_OR_DEPARTMENT_OTHER)
Admission: EM | Admit: 2020-03-31 | Discharge: 2020-03-31 | Disposition: A | Discharge status: Home / Self Care | Payer: Medicaid Other | Attending: Emergency Medicine | Admitting: Emergency Medicine

## 2020-03-31 ENCOUNTER — Other Ambulatory Visit: Payer: Self-pay

## 2020-03-31 DIAGNOSIS — Y92481 Parking lot as the place of occurrence of the external cause: Secondary | ICD-10-CM | POA: Insufficient documentation

## 2020-03-31 DIAGNOSIS — Z8542 Personal history of malignant neoplasm of other parts of uterus: Secondary | ICD-10-CM | POA: Insufficient documentation

## 2020-03-31 DIAGNOSIS — Z87891 Personal history of nicotine dependence: Secondary | ICD-10-CM | POA: Diagnosis not present

## 2020-03-31 DIAGNOSIS — Z7982 Long term (current) use of aspirin: Secondary | ICD-10-CM | POA: Insufficient documentation

## 2020-03-31 DIAGNOSIS — Z91040 Latex allergy status: Secondary | ICD-10-CM | POA: Diagnosis not present

## 2020-03-31 DIAGNOSIS — R0781 Pleurodynia: Secondary | ICD-10-CM | POA: Diagnosis not present

## 2020-03-31 DIAGNOSIS — R109 Unspecified abdominal pain: Secondary | ICD-10-CM | POA: Insufficient documentation

## 2020-03-31 DIAGNOSIS — Z96641 Presence of right artificial hip joint: Secondary | ICD-10-CM | POA: Diagnosis not present

## 2020-03-31 HISTORY — DX: Idiopathic aseptic necrosis of left femur: M87.052

## 2020-03-31 LAB — BASIC METABOLIC PANEL
Anion gap: 11 (ref 5–15)
BUN: 11 mg/dL (ref 6–20)
CO2: 26 mmol/L (ref 22–32)
Calcium: 9.4 mg/dL (ref 8.9–10.3)
Chloride: 100 mmol/L (ref 98–111)
Creatinine, Ser: 0.76 mg/dL (ref 0.44–1.00)
GFR, Estimated: >60 mL/min (ref >60)
Glucose, Bld: 108 mg/dL — ABNORMAL HIGH (ref 70–99)
Potassium: 4.1 mmol/L (ref 3.5–5.1)
Sodium: 137 mmol/L (ref 135–145)

## 2020-03-31 LAB — COMPLETE METABOLIC PANEL WITH GFR
AG Ratio: 1.4 (calc) (ref 1.0–2.5)
ALT: 9 U/L (ref 6–29)
AST: 14 U/L (ref 10–30)
Albumin: 4.1 g/dL (ref 3.6–5.1)
Alkaline phosphatase (APISO): 72 U/L (ref 31–125)
BUN: 10 mg/dL (ref 7–25)
CO2: 22 mmol/L (ref 20–32)
Calcium: 9.7 mg/dL (ref 8.6–10.2)
Chloride: 105 mmol/L (ref 98–110)
Creat: 0.69 mg/dL (ref 0.50–1.10)
GFR, Est African American: 133 mL/min/{1.73_m2} (ref >=60)
GFR, Est Non African American: 114 mL/min/{1.73_m2} (ref >=60)
Globulin: 3 g/dL (calc) (ref 1.9–3.7)
Glucose, Bld: 96 mg/dL (ref 65–99)
Potassium: 4.6 mmol/L (ref 3.5–5.3)
Sodium: 139 mmol/L (ref 135–146)
Total Bilirubin: 0.5 mg/dL (ref 0.2–1.2)
Total Protein: 7.1 g/dL (ref 6.1–8.1)

## 2020-03-31 LAB — CBC
HCT: 40.9 % (ref 36.0–46.0)
Hemoglobin: 13.9 g/dL (ref 12.0–15.0)
MCH: 33.2 pg (ref 26.0–34.0)
MCHC: 34 g/dL (ref 30.0–36.0)
MCV: 97.6 fL (ref 80.0–100.0)
Platelets: 317 10*3/uL (ref 150–400)
RBC: 4.19 MIL/uL (ref 3.87–5.11)
RDW: 13 % (ref 11.5–15.5)
WBC: 8.8 10*3/uL (ref 4.0–10.5)
nRBC: 0 % (ref 0.0–0.2)

## 2020-03-31 MED ORDER — IOHEXOL 300 MG/ML  SOLN
100.0000 mL | Freq: Once | INTRAMUSCULAR | Status: AC | PRN
  Administered 2020-03-31: 100 mL via INTRAVENOUS
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
## 2020-03-31 NOTE — Discharge Instructions (Addendum)
Take over-the-counter medications as needed for pain.  Follow-up with your spine doctor if the symptoms have not improved in the next week.
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
## 2020-03-31 NOTE — ED Notes (Signed)
Discharge instructions discussed with patient. Verbalized understanding. Departs ED with friend at this time.

## 2020-04-12 DIAGNOSIS — Z419 Encounter for procedure for purposes other than remedying health state, unspecified: Secondary | ICD-10-CM | POA: Diagnosis not present

## 2020-04-24 ENCOUNTER — Encounter: Payer: Self-pay | Admitting: Orthopaedic Surgery

## 2020-04-24 ENCOUNTER — Ambulatory Visit (INDEPENDENT_AMBULATORY_CARE_PROVIDER_SITE_OTHER): Payer: Medicaid Other | Admitting: Orthopaedic Surgery

## 2020-04-24 VITALS — Ht 68.0 in | Wt 150.0 lb

## 2020-04-24 DIAGNOSIS — M25551 Pain in right hip: Secondary | ICD-10-CM

## 2020-04-24 DIAGNOSIS — J3489 Other specified disorders of nose and nasal sinuses: Secondary | ICD-10-CM | POA: Diagnosis not present

## 2020-04-24 DIAGNOSIS — S022XXA Fracture of nasal bones, initial encounter for closed fracture: Secondary | ICD-10-CM | POA: Diagnosis not present

## 2020-04-24 DIAGNOSIS — R202 Paresthesia of skin: Secondary | ICD-10-CM | POA: Diagnosis not present

## 2020-04-24 DIAGNOSIS — Z96641 Presence of right artificial hip joint: Secondary | ICD-10-CM

## 2020-04-24 DIAGNOSIS — M87052 Idiopathic aseptic necrosis of left femur: Secondary | ICD-10-CM

## 2020-04-24 DIAGNOSIS — R55 Syncope and collapse: Secondary | ICD-10-CM | POA: Diagnosis not present

## 2020-04-24 MED ORDER — METHOCARBAMOL 500 MG PO TABS: 500.0000 mg | ORAL_TABLET | Freq: Three times a day (TID) | ORAL | 1 refills | Status: DC

## 2020-04-24 MED ORDER — METHOCARBAMOL 500 MG PO TABS
500.0000 mg | ORAL_TABLET | Freq: Three times a day (TID) | ORAL | 1 refills | Status: DC
Start: 2020-04-24 — End: 2020-05-19
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
## 2020-04-27 DIAGNOSIS — Z3201 Encounter for pregnancy test, result positive: Secondary | ICD-10-CM | POA: Diagnosis not present

## 2020-04-27 DIAGNOSIS — R55 Syncope and collapse: Secondary | ICD-10-CM | POA: Diagnosis not present

## 2020-05-13 DIAGNOSIS — Z419 Encounter for procedure for purposes other than remedying health state, unspecified: Secondary | ICD-10-CM | POA: Diagnosis not present

## 2020-05-17 DIAGNOSIS — Z1159 Encounter for screening for other viral diseases: Secondary | ICD-10-CM | POA: Diagnosis not present

## 2020-05-19 ENCOUNTER — Other Ambulatory Visit: Payer: Self-pay

## 2020-05-19 ENCOUNTER — Emergency Department (INDEPENDENT_AMBULATORY_CARE_PROVIDER_SITE_OTHER)
Admission: RE | Admit: 2020-05-19 | Discharge: 2020-05-19 | Disposition: A | Discharge status: Home / Self Care | Payer: Medicaid Other | Source: Ambulatory Visit | Attending: Family Medicine | Admitting: Family Medicine

## 2020-05-19 VITALS — BP 109/72 | HR 82 | Temp 99.0°F | Resp 16 | Ht 68.0 in | Wt 150.0 lb

## 2020-05-19 DIAGNOSIS — H9203 Otalgia, bilateral: Secondary | ICD-10-CM

## 2020-05-19 DIAGNOSIS — J069 Acute upper respiratory infection, unspecified: Secondary | ICD-10-CM

## 2020-05-19 DIAGNOSIS — Z8669 Personal history of other diseases of the nervous system and sense organs: Secondary | ICD-10-CM

## 2020-05-19 MED ORDER — FLUTICASONE PROPIONATE 50 MCG/ACT NA SUSP: 2.0000 | Freq: Every day | NASAL | 2 refills | Status: DC

## 2020-05-19 MED ORDER — AMOXICILLIN 875 MG PO TABS: 875.0000 mg | ORAL_TABLET | Freq: Two times a day (BID) | ORAL | 0 refills | Status: DC

## 2020-05-19 NOTE — Discharge Instructions (Addendum)
Continue to drink plenty of fluids use Flonase 2 times a day until symptoms improve Take antibiotic 2 times a day for ear infection May take over-the-counter cough and cold medicines as needed

## 2020-05-19 NOTE — ED Triage Notes (Addendum)
Nasal congestion, sore throat, ear pain, headache x 4 days. Vaccinated Neg Rapid test yesterday

## 2020-05-20 NOTE — ED Provider Notes (Signed)
[facility]    CSN: [id number] Arrival date & time: 05/19/20  1804      History   Chief Complaint Chief Complaint  Patient presents with  . Nasal Congestion    HPI Erica Welch is a 35 y.o. female.   HPI   Patient has cold symptoms and congestion. 4 days onset.  Sore throat.  Ear pain.  Has had recurrent ear infections  And developed ear pain yesterday that is worse today.  Decreased hearing.  No cough or SOB.  No fever or chills.  Has a home test yesterday for COVID that is (-). Patient has multiple medications and conditions for a young woman and has had one hip replacement, second hip pending, fibromyalgia, vitamin deficiencies, heart disease, IBS.  Is under care mult specialists.    Past Medical History:  Diagnosis Date  . AV nodal tachycardia ([place]) 2017  . Avascular necrosis of hip, left ([place])   . Cancer ([place])    uterine   . Cubital tunnel syndrome 2017  . Dyspareunia due to medical condition in female   . Dysrhythmia    SVT  . Endometriosis   . Fibromyalgia   . GERD (gastroesophageal reflux disease)   . History of kidney stones   . Hyperlipidemia   . Hypermobility syndrome   . IBS (irritable bowel syndrome)   . Insomnia   . Memory loss   . Migraine   . Occipital neuralgia 2017  . Patellofemoral arthralgia of both knees   . Pneumonia    as a baby  . Right flank pain 2017  . Rotator cuff tendinitis 2017  . Vitamin B12 deficiency   . Vitamin D deficiency     Patient Active Problem List   Diagnosis Date Noted  . 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 Avascular necrosis of hip, left ([place]) 10/04/2019  . Avascular necrosis of hip, right ([place]) 10/04/2019  . Near syncope 10/04/2019  . Orthostatic hypotension 10/04/2019  . 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 History of 2 cesarean sections 09/01/2018  . Fibromyalgia 05/20/2017  . Acute sinusitis, unspecified 08/10/2015  . Chronic pain of left knee 07/07/2015  . Anxiety  07/05/2015  . Arthritis, multiple joint involvement 07/05/2015  . Asthma 07/05/2015  . AV nodal tachycardia ([place]) 07/05/2015  . Endometriosis 07/05/2015  . GERD without esophagitis 07/05/2015  . Hyperlipidemia, unspecified 07/05/2015  . Irritable bowel syndrome without diarrhea 07/05/2015  . Lung nodule 07/05/2015  . Fatty liver 09/07/2014  . Kidney stone 10/29/2012  . 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 Chest pain 05/20/2011  . Palpitations 05/20/2011  . SOB (shortness of breath) 05/20/2011    Past Surgical History:  Procedure Laterality Date  . ABDOMINAL SURGERY  2007,2010,2012,2014  . arthroscopy right knee    . cervical dics repacement    . CESAREAN SECTION  03/19/2006  . CESAREAN SECTION  12/09/2008  . NASAL SEPTUM SURGERY  2013  . PELVIC LAPAROSCOPY  2012  . PORTA CATH INSERTION     and removal  . TOTAL HIP ARTHROPLASTY Right 12/24/2019   Procedure: RIGHT TOTAL HIP ARTHROPLASTY ANTERIOR APPROACH;  Surgeon: [name], MD;  Location: [facility];  Service: Orthopedics;  Laterality: Right;  . TUBAL LIGATION N/A    and untied  . TYMPANOPLASTY    . WISDOM TOOTH EXTRACTION      OB History   No obstetric history on file.      Home Medications    Prior to  Admission medications   Medication Sig Start Date End Date Taking? Authorizing Provider  amoxicillin (AMOXIL) 875 MG tablet Take 1 tablet (875 mg total) by mouth 2 (two) times daily. 05/19/20  Yes Raylene Everts, MD  fluticasone [facility]) 50 MCG/ACT nasal spray Place 2 sprays into both nostrils daily. 05/19/20  Yes Raylene Everts, MD  acetaminophen (TYLENOL) 500 MG tablet Take 500-1,000 mg by mouth every 6 (six) hours as needed for headache.    [provider]  ibuprofen (ADVIL) 200 MG tablet Take 400-800 mg by mouth every 6 (six) hours as needed for headache or moderate pain.    [provider]  medroxyPROGESTERone (DEPO-PROVERA) 150 MG/ML injection Inject 150 mg into the muscle  every 3 (three) months.  03/08/19   [provider]  methocarbamol (ROBAXIN) 500 MG tablet Take 1 tablet (500 mg total) by mouth every 6 (six) hours as needed for muscle spasms. Patient not taking: Reported on 04/24/2020 12/25/19   [name], MD  ondansetron [facility]) 8 MG tablet Take 8 mg by mouth every 8 (eight) hours as needed for nausea or vomiting.  06/13/19   [provider]    Family History Family History  Problem Relation Age of Onset  . Thyroid disease Mother   . Hypertension Mother   . Cancer Mother   . Hyperlipidemia Father     Social History Social History   Tobacco Use  . Smoking status: Former Smoker    Years: 10.00    Types: Cigarettes  . Smokeless tobacco: Never Used  . Tobacco comment: off and on   Vaping Use  . Vaping Use: Never used  Substance Use Topics  . Alcohol use: Yes  . Drug use: Never     Allergies   Latex, Strawberry extract, and Adhesive [tape]   Review of Systems Review of Systems  See HPI Physical Exam Triage Vital Signs ED Triage Vitals  Enc Vitals Group     BP 05/19/20 1857 109/72     Pulse Rate 05/19/20 1857 82     Resp 05/19/20 1857 16     Temp 05/19/20 1857 99 F (37.2 C)     Temp Source 05/19/20 1857 Oral     SpO2 05/19/20 1857 96 %     Weight 05/19/20 1858 150 lb (68 kg)     Height 05/19/20 1858 5\' 8"  (1.727 m)     Head Circumference --      Peak Flow --      Pain Score 05/19/20 1858 6     Pain Loc --      Pain Edu? --      Excl. in [place]? --    No data found.  Updated Vital Signs BP 109/72 (BP Location: Right Arm)   Pulse 82   Temp 99 F (37.2 C) (Oral)   Resp 16   Ht 5\' 8"  (1.727 m)   Wt 68 kg   SpO2 96%   BMI 22.81 kg/m      Physical Exam Constitutional:      General: She is not in acute distress.    Appearance: She is well-developed, normal weight and well-nourished.  HENT:     Head: Normocephalic and atraumatic.     Right Ear: Ear canal and external ear normal.      Left Ear: Ear canal and external ear normal.     Ears:     Comments: Both TMs are scarred, right TM is injected and dull  Nose: Congestion present.     Mouth/Throat:     Mouth: Oropharynx is clear and moist.     Pharynx: No posterior oropharyngeal erythema.  Eyes:     Conjunctiva/sclera: Conjunctivae normal.     Pupils: Pupils are equal, round, and reactive to light.  Cardiovascular:     Rate and Rhythm: Normal rate and regular rhythm.     Heart sounds: Normal heart sounds.  Pulmonary:     Effort: Pulmonary effort is normal. No respiratory distress.     Breath sounds: Normal breath sounds.  Abdominal:     General: There is no distension.     Palpations: Abdomen is soft.  Musculoskeletal:        General: No edema. Normal range of motion.     Cervical back: Normal range of motion.  Lymphadenopathy:     Cervical: Cervical adenopathy present.  Skin:    General: Skin is warm and dry.  Neurological:     General: No focal deficit present.     Mental Status: She is alert.  Psychiatric:        Behavior: Behavior normal.      UC Treatments / Results  Labs (all labs ordered are listed, but only abnormal results are displayed) Labs Reviewed - No data to display  EKG   Radiology No results found.  Procedures Procedures (including critical care time)  Medications Ordered in UC Medications - No data to display  Initial Impression / Assessment and Plan / UC Course  I have reviewed the triage vital signs and the nursing notes.  Pertinent labs & imaging results that were available during my care of the patient were reviewed by me and considered in my medical decision making (see chart for details).     Viral URI with early Right OM.  Will treat with antibiotics and flonase.   Final Clinical Impressions(s) / UC Diagnoses   Final diagnoses:  Viral upper respiratory tract infection  Ear pain, bilateral  History of recurrent ear infection     Discharge Instructions      Continue to drink plenty of fluids use Flonase 2 times a day until symptoms improve Take antibiotic 2 times a day for ear infection May take over-the-counter cough and cold medicines as needed     ED Prescriptions    Medication Sig Dispense Auth. Provider   fluticasone (FLONASE) 50 MCG/ACT nasal spray Place 2 sprays into both nostrils daily. 16 g Raylene Everts, MD   amoxicillin (AMOXIL) 875 MG tablet Take 1 tablet (875 mg total) by mouth 2 (two) times daily. 14 tablet Raylene Everts, MD     PDMP not reviewed this encounter.   Raylene Everts, MD 05/20/20 [phone]

## 2020-06-13 DIAGNOSIS — Z419 Encounter for procedure for purposes other than remedying health state, unspecified: Secondary | ICD-10-CM | POA: Diagnosis not present

## 2020-06-13 DIAGNOSIS — R55 Syncope and collapse: Secondary | ICD-10-CM | POA: Diagnosis not present

## 2020-06-19 ENCOUNTER — Encounter: Payer: Self-pay | Admitting: Orthopaedic Surgery

## 2020-06-20 DIAGNOSIS — N390 Urinary tract infection, site not specified: Secondary | ICD-10-CM | POA: Diagnosis not present

## 2020-07-11 DIAGNOSIS — Z419 Encounter for procedure for purposes other than remedying health state, unspecified: Secondary | ICD-10-CM | POA: Diagnosis not present

## 2020-07-12 ENCOUNTER — Ambulatory Visit: Payer: Self-pay

## 2020-07-12 ENCOUNTER — Ambulatory Visit (INDEPENDENT_AMBULATORY_CARE_PROVIDER_SITE_OTHER): Payer: BC Managed Care – PPO | Admitting: Orthopaedic Surgery

## 2020-07-12 DIAGNOSIS — G8929 Other chronic pain: Secondary | ICD-10-CM | POA: Diagnosis not present

## 2020-07-12 DIAGNOSIS — M87052 Idiopathic aseptic necrosis of left femur: Secondary | ICD-10-CM | POA: Diagnosis not present

## 2020-07-12 DIAGNOSIS — M25562 Pain in left knee: Secondary | ICD-10-CM | POA: Diagnosis not present
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
## 2020-07-26 ENCOUNTER — Ambulatory Visit: Payer: Self-pay

## 2020-07-27 DIAGNOSIS — G9001 Carotid sinus syncope: Secondary | ICD-10-CM | POA: Diagnosis not present

## 2020-08-01 ENCOUNTER — Other Ambulatory Visit: Payer: Self-pay

## 2020-08-01 DIAGNOSIS — J189 Pneumonia, unspecified organism: Secondary | ICD-10-CM | POA: Insufficient documentation

## 2020-08-01 DIAGNOSIS — N9419 Other specified dyspareunia: Secondary | ICD-10-CM | POA: Insufficient documentation

## 2020-08-01 DIAGNOSIS — K589 Irritable bowel syndrome without diarrhea: Secondary | ICD-10-CM | POA: Insufficient documentation

## 2020-08-01 DIAGNOSIS — Z87442 Personal history of urinary calculi: Secondary | ICD-10-CM | POA: Insufficient documentation

## 2020-08-01 DIAGNOSIS — I499 Cardiac arrhythmia, unspecified: Secondary | ICD-10-CM | POA: Insufficient documentation

## 2020-08-01 DIAGNOSIS — R413 Other amnesia: Secondary | ICD-10-CM | POA: Insufficient documentation

## 2020-08-01 DIAGNOSIS — G43909 Migraine, unspecified, not intractable, without status migrainosus: Secondary | ICD-10-CM | POA: Insufficient documentation

## 2020-08-01 DIAGNOSIS — E559 Vitamin D deficiency, unspecified: Secondary | ICD-10-CM | POA: Insufficient documentation

## 2020-08-01 DIAGNOSIS — K219 Gastro-esophageal reflux disease without esophagitis: Secondary | ICD-10-CM | POA: Insufficient documentation

## 2020-08-01 DIAGNOSIS — E785 Hyperlipidemia, unspecified: Secondary | ICD-10-CM | POA: Insufficient documentation

## 2020-08-01 DIAGNOSIS — E538 Deficiency of other specified B group vitamins: Secondary | ICD-10-CM | POA: Insufficient documentation

## 2020-08-01 DIAGNOSIS — M222X1 Patellofemoral disorders, right knee: Secondary | ICD-10-CM | POA: Insufficient documentation

## 2020-08-01 DIAGNOSIS — G4700 Insomnia, unspecified: Secondary | ICD-10-CM | POA: Insufficient documentation

## 2020-08-01 DIAGNOSIS — M357 Hypermobility syndrome: Secondary | ICD-10-CM | POA: Insufficient documentation

## 2020-08-01 DIAGNOSIS — C801 Malignant (primary) neoplasm, unspecified: Secondary | ICD-10-CM | POA: Insufficient documentation

## 2020-08-04 ENCOUNTER — Encounter: Payer: Self-pay | Admitting: Cardiology

## 2020-08-04 ENCOUNTER — Other Ambulatory Visit: Payer: Self-pay

## 2020-08-04 ENCOUNTER — Ambulatory Visit (INDEPENDENT_AMBULATORY_CARE_PROVIDER_SITE_OTHER): Payer: BC Managed Care – PPO | Admitting: Cardiology

## 2020-08-04 VITALS — BP 112/74 | HR 74 | Ht 68.0 in | Wt 154.1 lb

## 2020-08-04 DIAGNOSIS — I951 Orthostatic hypotension: Secondary | ICD-10-CM

## 2020-08-04 DIAGNOSIS — E782 Mixed hyperlipidemia: Secondary | ICD-10-CM | POA: Diagnosis not present

## 2020-08-04 DIAGNOSIS — G909 Disorder of the autonomic nervous system, unspecified: Secondary | ICD-10-CM | POA: Diagnosis not present

## 2020-08-04 NOTE — Patient Instructions (Signed)

## 2020-08-04 NOTE — Progress Notes (Signed)
Cardiology Office Note:    Date:  08/04/2020   ID:  Erica Welch, DOB 01/31/1986, MRN [id number]  PCP:  Madelaine Bhat., MD  Cardiologist:  Berniece Salines, DO  Electrophysiologist:  None   Referring MD: Madelaine Bhat., MD   I am doing okay  History of Present Illness:    Erica Welch is a 35 y.o. 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  Due to her presyncope episodes I recommended patient see EP who at the time thought that her symptoms were neurally mediated.  We recommended Florinef but the patient was hesitant to start this and secondly her orthopedic surgeon had recommended against it therefore she preferred to continue with nonpharmacologic approaches.  She is here today for follow-up visit.  Since I last saw the patient she has been doing well.  But he reports an incident where she woke up in the middle the night went to the bathroom urinated and then passed out.  She had a carotid ultrasound today which was read by her PCP had been experiencing some carotid pain.  Was able to review the ultrasound report at [facility] which showed no evidence of any hemodynamically significant stenosis in either carotid.  She did see neurology no interventions have been done.  The patient is planning an upcoming hip surgery-left hip due to avascular necrosis.  Past Medical History:  Diagnosis Date  . AV nodal tachycardia ([place]) 2017  . Avascular necrosis of hip, left ([place])   . Cancer ([place])    uterine   . Cubital tunnel syndrome 2017  . Dyspareunia due to medical condition in female   . Dysrhythmia    SVT  . Endometriosis   . Fibromyalgia   . GERD (gastroesophageal reflux disease)   . History of kidney stones   . Hyperlipidemia   . Hypermobility syndrome   . IBS (irritable bowel syndrome)   . Insomnia   . Memory loss   . Migraine   . Occipital neuralgia 2017  .  Patellofemoral arthralgia of both knees   . Pneumonia    as a baby  . Right flank pain 2017  . Rotator cuff tendinitis 2017  . Vitamin B12 deficiency   . Vitamin D deficiency     Past Surgical History:  Procedure Laterality Date  . ABDOMINAL SURGERY  2007,2010,2012,2014  . arthroscopy right knee    . cervical dics repacement    . CESAREAN SECTION  03/19/2006  . CESAREAN SECTION  12/09/2008  . NASAL SEPTUM SURGERY  2013  . PELVIC LAPAROSCOPY  2012  . PORTA CATH INSERTION     and removal  . TOTAL HIP ARTHROPLASTY Right 12/24/2019   Procedure: RIGHT TOTAL HIP ARTHROPLASTY ANTERIOR APPROACH;  Surgeon: [name], MD;  Location: [facility];  Service: Orthopedics;  Laterality: Right;  . TUBAL LIGATION N/A    and untied  . TYMPANOPLASTY    . WISDOM TOOTH EXTRACTION      Current Medications: Current Meds  Medication Sig  . acetaminophen (TYLENOL) 500 MG tablet Take 500-1,000 mg by mouth every 6 (six) hours as needed for headache.  . fluticasone (FLONASE) 50 MCG/ACT nasal spray Place 2 sprays into both nostrils daily.  Marland Kitchen ibuprofen (ADVIL) 200 MG tablet Take 400-800 mg by mouth every 6 (six) hours as needed for headache or moderate pain.  . medroxyPROGESTERone (DEPO-PROVERA) 150 MG/ML injection  Inject 150 mg into the muscle every 3 (three) months.   . methocarbamol (ROBAXIN) 500 MG tablet Take 1 tablet (500 mg total) by mouth every 6 (six) hours as needed for muscle spasms.  . ondansetron (ZOFRAN) 8 MG tablet Take 8 mg by mouth every 8 (eight) hours as needed for nausea or vomiting.      Allergies:   Latex, Strawberry extract, and Adhesive [tape]   Social History   Socioeconomic History  . Marital status: Married    Spouse name: Not on file  . Number of children: Not on file  . Years of education: Not on file  . Highest education level: Not on file  Occupational History  . Not on file  Tobacco Use  . Smoking status: Former Smoker    Years: 10.00    Types:  Cigarettes  . Smokeless tobacco: Never Used  . Tobacco comment: off and on   Vaping Use  . Vaping Use: Never used  Substance and Sexual Activity  . Alcohol use: Yes  . Drug use: Never  . Sexual activity: Yes    Birth control/protection: None    Comment: Depo every 3 months  Other Topics Concern  . Not on file  Social History Narrative  . Not on file   Social Determinants of Health   Financial Resource Strain: Not on file  Food Insecurity: Not on file  Transportation Needs: Not on file  Physical Activity: Not on file  Stress: Not on file  Social Connections: Not on file     Family History: The patient's family history includes Cancer in her mother; Hyperlipidemia in her father; Hypertension in her mother; Thyroid disease in her mother.  ROS:   Review of Systems  Constitution: Negative for decreased appetite, fever and weight gain.  HENT: Negative for congestion, ear discharge, hoarse voice and sore throat.   Eyes: Negative for discharge, redness, vision loss in right eye and visual halos.  Cardiovascular: Negative for chest pain, dyspnea on exertion, leg swelling, orthopnea and palpitations.  Respiratory: Negative for cough, hemoptysis, shortness of breath and snoring.   Endocrine: Negative for heat intolerance and polyphagia.  Hematologic/Lymphatic: Negative for bleeding problem. Does not bruise/bleed easily.  Skin: Negative for flushing, nail changes, rash and suspicious lesions.  Musculoskeletal: Negative for arthritis, joint pain, muscle cramps, myalgias, neck pain and stiffness.  Gastrointestinal: Negative for abdominal pain, bowel incontinence, diarrhea and excessive appetite.  Genitourinary: Negative for decreased libido, genital sores and incomplete emptying.  Neurological: Negative for brief paralysis, focal weakness, headaches and loss of balance.  Psychiatric/Behavioral: Negative for altered mental status, depression and suicidal ideas.  Allergic/Immunologic:  Negative for HIV exposure and persistent infections.    EKGs/Labs/Other Studies Reviewed:    The following studies were reviewed today:   EKG: None today  Zio monitor The patient wore the monitor for 14 days starting Oct 04, 2019. Indication: Palpitations  The minimum heart rate was 45 bpm, maximum heart rate was 158 bpm, and average heart rate was 79 bpm. Predominant underlying rhythm was Sinus Rhythm.  Premature atrial complexes were were rare (<1.0%). Premature Ventricular complexes were rare (<1.0%),   No ventricular tachycardia, no pauses, No AV block, no supraventricular and no atrial fibrillation present. 1 dairy event noted associated with sinus rhythm.   Conclusion: Unremarkable/normal study.   Echo IMPRESSIONS 10/26/2019 1. Left ventricular ejection fraction, by estimation, is 60 to 65%. The left ventricle has normal function. The left ventricle has no regional wall motion abnormalities. Left  ventricular diastolic parameters were  normal.  2. Right ventricular systolic function is normal. The right ventricular size is normal.  3. The mitral valve is normal in structure. Trivial mitral valve regurgitation. No evidence of mitral stenosis.  4. The aortic valve is normal in structure. Aortic valve regurgitation is not visualized. No aortic stenosis is present.  5. The inferior vena cava is normal in size with greater than 50% respiratory variability, suggesting right atrial pressure of 3 mmHg.    Recent Labs: 03/30/2020: ALT 9 03/31/2020: BUN 11; Creatinine, Ser 0.76; Hemoglobin 13.9; Platelets 317; Potassium 4.1; Sodium 137  Recent Lipid Panel    Component Value Date/Time   CHOL 395 (H) 01/18/2020 1027   TRIG 155 (H) 01/18/2020 1027   HDL 57 01/18/2020 1027   CHOLHDL 6.9 (H) 01/18/2020 1027   LDLCALC 307 (H) 01/18/2020 1027    Physical Exam:    VS:  BP 112/74   Pulse 74   Ht 5\' 8"  (1.727 m)   Wt 154 lb 1.9 oz (69.9 kg)   SpO2 96%   BMI 23.43  kg/m     Wt Readings from Last 3 Encounters:  08/04/20 154 lb 1.9 oz (69.9 kg)  05/19/20 150 lb (68 kg)  04/24/20 150 lb (68 kg)     GEN: Well nourished, well developed in no acute distress HEENT: Normal NECK: No JVD; No carotid bruits LYMPHATICS: No lymphadenopathy CARDIAC: S1S2 noted,RRR, no murmurs, rubs, gallops RESPIRATORY:  Clear to auscultation without rales, wheezing or rhonchi  ABDOMEN: Soft, non-tender, non-distended, +bowel sounds, no guarding. EXTREMITIES: No edema, No cyanosis, no clubbing MUSCULOSKELETAL:  No deformity  SKIN: Warm and dry NEUROLOGIC:  Alert and oriented x 3, non-focal PSYCHIATRIC:  Normal affect, good insight  ASSESSMENT:    1. Autonomic dysfunction   2. Orthostatic hypotension   3. Mixed hyperlipidemia    PLAN:     The patient does not have any unstable cardiac conditions.  Upon evaluation today, she can achieve 4 METs or greater without anginal symptoms.  According to [facility] and AHA guidelines, she requires no further cardiac workup prior to her noncardiac surgery and should be at acceptable risk.  Our service is available as necessary in the perioperative period.  She is continue with nonpharmacologic methods for her orthostatic hypotension.  Diet control approach for hyperlipidemia.  The patient is in agreement with the above plan. The patient left the office in stable condition.  The patient will follow up in 1 year.   Medication Adjustments/Labs and Tests Ordered: Current medicines are reviewed at length with the patient today.  Concerns regarding medicines are outlined above.  No orders of the defined types were placed in this encounter.  No orders of the defined types were placed in this encounter.   Patient Instructions  Medication Instructions:  Your physician recommends that you continue on your current medications as directed. Please refer to the Current Medication list given to you today.  *If you need a refill on your  cardiac medications before your next appointment, please call your pharmacy*   Lab Work: None If you have labs (blood work) drawn today and your tests are completely normal, you will receive your results only by: Marland Kitchen MyChart Message (if you have MyChart) OR . A paper copy in the mail If you have any lab test that is abnormal or we need to change your treatment, we will call you to review the results.   Testing/Procedures: None   Follow-Up: At [facility],  you and your health needs are our priority.  As part of our continuing mission to provide you with exceptional heart care, we have created designated Provider Care Teams.  These Care Teams include your primary Cardiologist (physician) and Advanced Practice Providers (APPs -  Physician Assistants and Nurse Practitioners) who all work together to provide you with the care you need, when you need it.  We recommend signing up for the patient portal called "MyChart".  Sign up information is provided on this After Visit Summary.  MyChart is used to connect with patients for Virtual Visits (Telemedicine).  Patients are able to view lab/test results, encounter notes, upcoming appointments, etc.  Non-urgent messages can be sent to your provider as well.   To learn more about what you can do with MyChart, go to [email or web address].    Your next appointment:   1 year(s)  The format for your next appointment:   In Person  Provider:   Berniece Salines, DO   Other Instructions      Adopting a Healthy Lifestyle.  Know what a healthy weight is for you (roughly BMI <25) and aim to maintain this   Aim for 7+ servings of fruits and vegetables daily   65-80+ fluid ounces of water or unsweet tea for healthy kidneys   Limit to max 1 drink of alcohol per day; avoid smoking/tobacco   Limit animal fats in diet for cholesterol and heart health - choose grass fed whenever available   Avoid highly processed foods, and foods high in  saturated/trans fats   Aim for low stress - take time to unwind and care for your mental health   Aim for 150 min of moderate intensity exercise weekly for heart health, and weights twice weekly for bone health   Aim for 7-9 hours of sleep daily   When it comes to diets, agreement about the perfect plan isnt easy to find, even among the experts. Experts at the [employer] developed an idea known as the Healthy Eating Plate. Just imagine a plate divided into logical, healthy portions.   The emphasis is on diet quality:   Load up on vegetables and fruits - one-half of your plate: Aim for color and variety, and remember that potatoes dont count.   Go for whole grains - one-quarter of your plate: Whole wheat, barley, wheat berries, quinoa, oats, brown rice, and foods made with them. If you want pasta, go with whole wheat pasta.   Protein power - one-quarter of your plate: Fish, chicken, beans, and nuts are all healthy, versatile protein sources. Limit red meat.   The diet, however, does go beyond the plate, offering a few other suggestions.   Use healthy plant oils, such as olive, canola, soy, corn, sunflower and peanut. Check the labels, and avoid partially hydrogenated oil, which have unhealthy trans fats.   If youre thirsty, drink water. Coffee and tea are good in moderation, but skip sugary drinks and limit milk and dairy products to one or two daily servings.   The type of carbohydrate in the diet is more important than the amount. Some sources of carbohydrates, such as vegetables, fruits, whole grains, and beans-are healthier than others.   Finally, stay active  Signed, Berniece Salines, DO  08/04/2020 4:21 PM    [facility]

## 2020-08-11 DIAGNOSIS — Z419 Encounter for procedure for purposes other than remedying health state, unspecified: Secondary | ICD-10-CM | POA: Diagnosis not present

## 2020-08-22 ENCOUNTER — Emergency Department (INDEPENDENT_AMBULATORY_CARE_PROVIDER_SITE_OTHER)
Admission: RE | Admit: 2020-08-22 | Discharge: 2020-08-22 | Disposition: A | Discharge status: Home / Self Care | Payer: BC Managed Care – PPO | Source: Ambulatory Visit | Attending: Family Medicine | Admitting: Family Medicine

## 2020-08-22 ENCOUNTER — Emergency Department (INDEPENDENT_AMBULATORY_CARE_PROVIDER_SITE_OTHER): Payer: BC Managed Care – PPO

## 2020-08-22 ENCOUNTER — Other Ambulatory Visit: Payer: Self-pay

## 2020-08-22 VITALS — BP 121/82 | HR 78 | Temp 99.1°F | Resp 16 | Ht 68.0 in | Wt 152.0 lb

## 2020-08-22 DIAGNOSIS — M25531 Pain in right wrist: Secondary | ICD-10-CM

## 2020-08-22 DIAGNOSIS — S63501A Unspecified sprain of right wrist, initial encounter: Secondary | ICD-10-CM | POA: Diagnosis not present

## 2020-08-22 NOTE — Discharge Instructions (Addendum)
Continue to wear splint.  May take ibuprofen or Tylenol as needed.

## 2020-08-22 NOTE — ED Provider Notes (Signed)
Erica Welch [facility]    CSN: [id number] Arrival date & time: 08/22/20  1734      History   Chief Complaint Chief Complaint  Patient presents with  . Wrist Pain    Right     HPI Erica Welch is a 35 y.o. female.   Two weeks ago while in her car, patient caught her right hand/wrist between the console and seat.  She twisted her wrist as she tried to free it and felt a painful pulling sensation.  She has had persistent pain in her wrist at rest and with movement, not improved with ice packs, ibuprofen, Tylenol, and wrist brace.  The history is provided by the patient.  Wrist Pain This is a new problem. The current episode started more than 1 week ago. The problem occurs constantly. The problem has not changed since onset.Exacerbated by: wrist movement. Nothing relieves the symptoms. She has tried acetaminophen (ice packs, ibuprofen and wrist brace) for the symptoms. The treatment provided no relief.    Past Medical History:  Diagnosis Date  . AV nodal tachycardia ([place]) 2017  . Avascular necrosis of hip, left ([place])   . Cancer ([place])    uterine   . Cubital tunnel syndrome 2017  . Dyspareunia due to medical condition in female   . Dysrhythmia    SVT  . Endometriosis   . Fibromyalgia   . GERD (gastroesophageal reflux disease)   . History of kidney stones   . Hyperlipidemia   . Hypermobility syndrome   . IBS (irritable bowel syndrome)   . Insomnia   . Memory loss   . Migraine   . Occipital neuralgia 2017  . Patellofemoral arthralgia of both knees   . Pneumonia    as a baby  . Right flank pain 2017  . Rotator cuff tendinitis 2017  . Vitamin B12 deficiency   . Vitamin D deficiency     Patient Active Problem List   Diagnosis Date Noted  . Vitamin D deficiency   . Vitamin B12 deficiency   . Pneumonia   . Patellofemoral arthralgia of both knees   . Migraine   . Memory loss   . Insomnia   . IBS (irritable bowel syndrome)   . Hypermobility syndrome   .  Hyperlipidemia   . History of kidney stones   . GERD (gastroesophageal reflux disease)   . Dysrhythmia   . Dyspareunia due to medical condition in female   . Cancer ([place])   . Avascular necrosis of bone of left hip ([place]) 07/12/2020  . 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 Avascular necrosis of hip, left ([place]) 10/04/2019  . Avascular necrosis of hip, right ([place]) 10/04/2019  . Near syncope 10/04/2019  . Orthostatic hypotension 10/04/2019  . 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 History of 2 cesarean sections 09/01/2018  . Fibromyalgia 05/20/2017  . Acute sinusitis, unspecified 08/10/2015  . Chronic pain of left knee 07/07/2015  . Anxiety 07/05/2015  . Arthritis, multiple joint involvement 07/05/2015  . Asthma 07/05/2015  . AV nodal tachycardia ([place]) 07/05/2015  . Endometriosis 07/05/2015  . GERD without esophagitis 07/05/2015  . Hyperlipidemia, unspecified 07/05/2015  . Irritable bowel syndrome without diarrhea 07/05/2015  . Lung nodule 07/05/2015  . Rotator cuff tendinitis 2017  . Right flank pain 2017  . Occipital neuralgia 2017  . Cubital tunnel syndrome 2017  . Fatty liver 09/07/2014  . Kidney stone 10/29/2012  . 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 Chest pain 05/20/2011  .  Palpitations 05/20/2011  . SOB (shortness of breath) 05/20/2011    Past Surgical History:  Procedure Laterality Date  . ABDOMINAL SURGERY  2007,2010,2012,2014  . arthroscopy right knee    . cervical dics repacement    . CESAREAN SECTION  03/19/2006  . CESAREAN SECTION  12/09/2008  . NASAL SEPTUM SURGERY  2013  . PELVIC LAPAROSCOPY  2012  . PORTA CATH INSERTION     and removal  . TOTAL HIP ARTHROPLASTY Right 12/24/2019   Procedure: RIGHT TOTAL HIP ARTHROPLASTY ANTERIOR APPROACH;  Surgeon: [name], MD;  Location: [facility];  Service: Orthopedics;  Laterality: Right;  . TUBAL LIGATION N/A    and untied  . TYMPANOPLASTY    . WISDOM TOOTH EXTRACTION       OB History   No obstetric history on file.      Home Medications    Prior to Admission medications   Medication Sig Start Date End Date Taking? Authorizing Provider  acetaminophen (TYLENOL) 500 MG tablet Take 500-1,000 mg by mouth every 6 (six) hours as needed for headache.    [provider]  fluticasone (FLONASE) 50 MCG/ACT nasal spray Place 2 sprays into both nostrils daily. Patient not taking: Reported on 08/22/2020 05/19/20   Raylene Everts, MD  ibuprofen (ADVIL) 200 MG tablet Take 400-800 mg by mouth every 6 (six) hours as needed for headache or moderate pain.    [provider]  medroxyPROGESTERone (DEPO-PROVERA) 150 MG/ML injection Inject 150 mg into the muscle every 3 (three) months.  Patient not taking: Reported on 08/22/2020 03/08/19   [provider]  methocarbamol (ROBAXIN) 500 MG tablet Take 1 tablet (500 mg total) by mouth every 6 (six) hours as needed for muscle spasms. Patient not taking: Reported on 08/22/2020 12/25/19   [name], MD  ondansetron [facility]) 8 MG tablet Take 8 mg by mouth every 8 (eight) hours as needed for nausea or vomiting.  06/13/19   [provider]    Family History Family History  Problem Relation Age of Onset  . Thyroid disease Mother   . Hypertension Mother   . Cancer Mother   . Hyperlipidemia Father     Social History Social History   Tobacco Use  . Smoking status: Former Smoker    Years: 10.00    Types: Cigarettes  . Smokeless tobacco: Never Used  . Tobacco comment: off and on   Vaping Use  . Vaping Use: Never used  Substance Use Topics  . Alcohol use: Yes  . Drug use: Never     Allergies   Latex, Strawberry extract, and Adhesive [tape]   Review of Systems Review of Systems  Constitutional: Negative for activity change.  Musculoskeletal: Negative for joint swelling.  Skin: Negative for color change.  All other systems reviewed and are negative.    Physical  Exam Triage Vital Signs ED Triage Vitals  Enc Vitals Group     BP 08/22/20 1754 121/82     Pulse Rate 08/22/20 1754 78     Resp 08/22/20 1754 16     Temp 08/22/20 1754 99.1 F (37.3 C)     Temp Source 08/22/20 1754 Oral     SpO2 08/22/20 1754 100 %     Weight 08/22/20 1755 152 lb (68.9 kg)     Height 08/22/20 1755 5\' 8"  (1.727 m)     Head Circumference --      Peak Flow --      Pain Score 08/22/20  1755 5     Pain Loc --      Pain Edu? --      Excl. in [place]? --    No data found.  Updated Vital Signs BP 121/82 (BP Location: Left Arm)   Pulse 78   Temp 99.1 F (37.3 C) (Oral)   Resp 16   Ht 5\' 8"  (1.727 m)   Wt 68.9 kg   LMP 08/11/2020 (Exact Date)   SpO2 100%   BMI 23.11 kg/m   Visual Acuity Right Eye Distance:   Left Eye Distance:   Bilateral Distance:    Right Eye Near:   Left Eye Near:    Bilateral Near:     Physical Exam Vitals and nursing note reviewed.  Constitutional:      General: She is not in acute distress. HENT:     Head: Normocephalic.  Eyes:     Pupils: Pupils are equal, round, and reactive to light.  Cardiovascular:     Rate and Rhythm: Normal rate.  Pulmonary:     Effort: Pulmonary effort is normal.  Musculoskeletal:     Right wrist: Tenderness present. No swelling, deformity, snuff box tenderness or crepitus. Normal range of motion. Normal pulse.       Hands:     Comments: Right wrist has distinct tenderness to palpation laterally at the distal ulna as noted on diagram.   Skin:    General: Skin is warm and dry.  Neurological:     Mental Status: She is alert and oriented to person, place, and time.      UC Treatments / Results  Labs (all labs ordered are listed, but only abnormal results are displayed) Labs Reviewed - No data to display  EKG   Radiology DG Wrist Complete Right  Result Date: 08/22/2020 CLINICAL DATA:  Right wrist pain after injury 2 weeks ago. EXAM: RIGHT WRIST - COMPLETE 3+ VIEW COMPARISON:  None. FINDINGS:  There is no evidence of fracture or dislocation. There is no evidence of arthropathy or other focal bone abnormality. Soft tissues are unremarkable. IMPRESSION: Negative. Electronically Signed   By: Marijo Conception M.D.   On: 08/22/2020 18:26    Procedures Procedures (including critical care time)  Medications Ordered in UC Medications - No data to display  Initial Impression / Assessment and Plan / UC Course  I have reviewed the triage vital signs and the nursing notes.  Pertinent labs & imaging results that were available during my care of the patient were reviewed by me and considered in my medical decision making (see chart for details).    Note negative x-ray of right wrist. Concern for possible TFCC injury. Recommend follow-up with Dr. Timothy Lasso as soon as possible.    Final Clinical Impressions(s) / UC Diagnoses   Final diagnoses:  Sprain of right wrist, initial encounter     Discharge Instructions     Continue to wear splint.  May take ibuprofen or Tylenol as needed.    ED Prescriptions    None        Kandra Nicolas, MD 08/23/20 1124

## 2020-08-22 NOTE — ED Triage Notes (Signed)
R wrist pain x 2 weeks  Caught hand between drivers seat & [place]e in between seat  Twisted  R wrist - pain at rest or with ROM Pain since then - min relief w/ OTC tylenol & motrin - ice & heat  Wearing a brace  Moderna vaccine - booster 3 weeks ago

## 2020-08-24 ENCOUNTER — Ambulatory Visit (INDEPENDENT_AMBULATORY_CARE_PROVIDER_SITE_OTHER): Payer: BC Managed Care – PPO | Admitting: Sports Medicine

## 2020-08-24 ENCOUNTER — Other Ambulatory Visit: Payer: Self-pay

## 2020-08-24 ENCOUNTER — Encounter: Payer: Self-pay | Admitting: Sports Medicine

## 2020-08-24 VITALS — BP 122/80 | Ht 68.0 in | Wt 155.0 lb

## 2020-08-24 DIAGNOSIS — S66911A Strain of unspecified muscle, fascia and tendon at wrist and hand level, right hand, initial encounter: Secondary | ICD-10-CM | POA: Diagnosis not present

## 2020-08-24 MED ORDER — MELOXICAM 15 MG PO TABS: ORAL_TABLET | ORAL | 0 refills | Status: DC

## 2020-08-24 NOTE — Progress Notes (Addendum)
   Subjective:    Patient ID: Erica Welch, female    DOB: 1986-01-18, 35 y.o.   MRN: [id number]  HPI chief complaint: Right wrist pain  Very pleasant 35 year old right hand dominant female comes in today complaining of right wrist pain that began a couple of weeks ago.  She was reaching between her car seat in the center console when she got her hand stuck awkwardly in between.  She twisted her hand and wrist trying to get free and had immediate pain that she localizes both to the ulnar and radial side of the wrist.  She was eventually seen at an urgent care on April 12.  X-rays at that time were unremarkable.  She has been wearing a wrist brace ever since but she continues to have pain.  She has tried ice, heat, Tylenol, and ibuprofen but still having significant discomfort.  She denies any previous injury to her wrist.  Only hand injury was a remote right ring fracture.  Interim medical history reviewed Medications reviewed Allergies reviewed    Review of Systems As above    Objective:   Physical Exam  Well-developed, well-nourished.  No acute distress.  Right wrist: Full range of motion.  No effusion.  No soft tissue swelling.  No ecchymosis.  Patient is tender to palpation along the course of the extensor carpi ulnaris tendon distally.  Reproducible pain with resisted ulnar deviation.  There is also some slight tenderness to palpation just volar to the first extensor compartment on the radial side of the wrist.  Negative Finkelstein's.  Good pulses.  X-rays of her right wrist including AP and lateral views are unremarkable      Assessment & Plan:   Right wrist pain likely secondary to tendon strain  Based on her mechanism of injury and normal radiographs I suspect that she suffered a slight strain to the extensor carpi ulnaris tendon and possibly one of the volar tendons as well.  I recommended that she try a body helix compression sleeve and we will start meloxicam 15 mg daily  for 1 week.  I reassured her that symptoms should begin to improve over the next week or 2.  If that is not the case then we will need to consider further diagnostic imaging or referral to the hand specialists.  Follow-up for ongoing or recalcitrant issues.

## 2020-09-10 DIAGNOSIS — Z419 Encounter for procedure for purposes other than remedying health state, unspecified: Secondary | ICD-10-CM | POA: Diagnosis not present

## 2020-09-13 ENCOUNTER — Other Ambulatory Visit: Payer: Self-pay

## 2020-09-14 ENCOUNTER — Other Ambulatory Visit: Payer: Self-pay

## 2020-09-15 DIAGNOSIS — S63591A Other specified sprain of right wrist, initial encounter: Secondary | ICD-10-CM | POA: Diagnosis not present

## 2020-09-20 ENCOUNTER — Encounter: Payer: Self-pay | Admitting: Orthopaedic Surgery

## 2020-09-25 NOTE — Progress Notes (Signed)
Need orders in epic.  Preop on 5/18.  Surgery on 5/27.

## 2020-09-27 ENCOUNTER — Other Ambulatory Visit: Payer: Self-pay | Admitting: Physician Assistant

## 2020-09-27 ENCOUNTER — Encounter (HOSPITAL_COMMUNITY): Payer: Self-pay

## 2020-09-27 ENCOUNTER — Encounter (HOSPITAL_COMMUNITY)
Admission: RE | Admit: 2020-09-27 | Discharge: 2020-09-27 | Disposition: A | Discharge status: Home / Self Care | Payer: Medicaid Other | Source: Ambulatory Visit | Attending: Orthopaedic Surgery | Admitting: Orthopaedic Surgery

## 2020-09-27 ENCOUNTER — Other Ambulatory Visit: Payer: Self-pay

## 2020-09-27 DIAGNOSIS — Z01812 Encounter for preprocedural laboratory examination: Secondary | ICD-10-CM | POA: Diagnosis not present

## 2020-09-27 HISTORY — DX: Personal history of Methicillin resistant Staphylococcus aureus infection: Z86.14

## 2020-09-27 HISTORY — DX: Unspecified osteoarthritis, unspecified site: M19.90

## 2020-09-27 HISTORY — DX: Disorder of the autonomic nervous system, unspecified: G90.9

## 2020-09-27 HISTORY — DX: Orthostatic hypotension: I95.1

## 2020-09-27 LAB — BASIC METABOLIC PANEL
Anion gap: 14 (ref 5–15)
BUN: 6 mg/dL (ref 6–20)
CO2: 22 mmol/L (ref 22–32)
Calcium: 9.3 mg/dL (ref 8.9–10.3)
Chloride: 103 mmol/L (ref 98–111)
Creatinine, Ser: 0.56 mg/dL (ref 0.44–1.00)
GFR, Estimated: >60 mL/min (ref >60)
Glucose, Bld: 81 mg/dL (ref 70–99)
Potassium: 4.2 mmol/L (ref 3.5–5.1)
Sodium: 139 mmol/L (ref 135–145)

## 2020-09-27 LAB — SURGICAL PCR SCREEN
MRSA, PCR: NEGATIVE
Staphylococcus aureus: NEGATIVE

## 2020-09-27 LAB — CBC
HCT: 45.6 % (ref 36.0–46.0)
Hemoglobin: 15.6 g/dL — ABNORMAL HIGH (ref 12.0–15.0)
MCH: 34.7 pg — ABNORMAL HIGH (ref 26.0–34.0)
MCHC: 34.2 g/dL (ref 30.0–36.0)
MCV: 101.3 fL — ABNORMAL HIGH (ref 80.0–100.0)
Platelets: 251 10*3/uL (ref 150–400)
RBC: 4.5 MIL/uL (ref 3.87–5.11)
RDW: 12.7 % (ref 11.5–15.5)
WBC: 6.3 10*3/uL (ref 4.0–10.5)
nRBC: 0 % (ref 0.0–0.2)

## 2020-09-27 NOTE — Progress Notes (Signed)
DUE TO COVID-19 ONLY ONE VISITOR IS ALLOWED TO COME WITH YOU AND STAY IN THE WAITING ROOM ONLY DURING PRE OP AND PROCEDURE DAY OF SURGERY. THE 1 VISITOR  MAY VISIT WITH YOU AFTER SURGERY IN YOUR PRIVATE ROOM DURING VISITING HOURS ONLY!  YOU NEED TO HAVE A COVID 19 TEST ON__5/24/2022 _____ @_______ , THIS TEST MUST BE DONE BEFORE SURGERY,  COVID TESTING SITE [address] [place] [address], IT IS ON THE RIGHT GOING OUT [address] APPROXIMATELY  2 MINUTES PAST [employer] ON THE RIGHT. ONCE YOUR COVID TEST IS COMPLETED,  PLEASE BEGIN THE QUARANTINE INSTRUCTIONS AS OUTLINED IN YOUR HANDOUT.                Erica Welch  09/27/2020   Your procedure is scheduled on:  10/06/2020   Report to [facility] Main  Entrance   Report to admitting at     [place] AM     Call this number if you have problems the morning of surgery [phone]    Remember: Do not eat food , candy gum or mints :After Midnight. You may have clear liquids from midnight until    0915am     CLEAR LIQUID DIET   Foods Allowed                                                                       Coffee and tea, regular and decaf                              Plain Jell-O any favor except red or purple                                            Fruit ices (not with fruit pulp)                                      Iced Popsicles                                     Carbonated beverages, regular and diet                                    Cranberry, grape and apple juices Sports drinks like Gatorade Lightly seasoned clear broth or consume(fat free) Sugar, honey syrup   _____________________________________________________________________    BRUSH YOUR TEETH MORNING OF SURGERY AND RINSE YOUR MOUTH OUT, NO CHEWING GUM CANDY OR MINTS.     Take these medicines the morning of surgery with A SIP OF WATER:   none  DO NOT TAKE ANY DIABETIC MEDICATIONS DAY OF YOUR SURGERY                                You may not have  any metal on your body including hair pins and              piercings  Do not wear jewelry, make-up, lotions, powders or perfumes, deodorant             Do not wear nail polish on your fingernails.  Do not shave  48 hours prior to surgery.              Men may shave face and neck.   Do not bring valuables to the hospital. [place].  Contacts, dentures or bridgework may not be worn into surgery.  Leave suitcase in the car. After surgery it may be brought to your room.     Patients discharged the day of surgery will not be allowed to drive home. IF YOU ARE HAVING SURGERY AND GOING HOME THE SAME DAY, YOU MUST HAVE AN ADULT TO DRIVE YOU HOME AND BE WITH YOU FOR 24 HOURS. YOU MAY GO HOME BY TAXI OR UBER OR ORTHERWISE, BUT AN ADULT MUST ACCOMPANY YOU HOME AND STAY WITH YOU FOR 24 HOURS.  Name and phone number of your driver:  Special Instructions: N/A              Please read over the following fact sheets you were given: _____________________________________________________________________  [facility] - Preparing for Surgery Before surgery, you can play an important role.  Because skin is not sterile, your skin needs to be as free of germs as possible.  You can reduce the number of germs on your skin by washing with CHG (chlorahexidine gluconate) soap before surgery.  CHG is an antiseptic cleaner which kills germs and bonds with the skin to continue killing germs even after washing. Please DO NOT use if you have an allergy to CHG or antibacterial soaps.  If your skin becomes reddened/irritated stop using the CHG and inform your nurse when you arrive at Short Stay. Do not shave (including legs and underarms) for at least 48 hours prior to the first CHG shower.  You may shave your face/neck. Please follow these instructions carefully:  1.  Shower with CHG Soap the night before surgery and the  morning of Surgery.  2.  If you  choose to wash your hair, wash your hair first as usual with your  normal  shampoo.  3.  After you shampoo, rinse your hair and body thoroughly to remove the  shampoo.                           4.  Use CHG as you would any other liquid soap.  You can apply chg directly  to the skin and wash                       Gently with a scrungie or clean washcloth.  5.  Apply the CHG Soap to your body ONLY FROM THE NECK DOWN.   Do not use on face/ open                           Wound or open sores. Avoid contact with eyes, ears mouth and genitals (private parts).                       Wash  face,  Genitals (private parts) with your normal soap.             6.  Wash thoroughly, paying special attention to the area where your surgery  will be performed.  7.  Thoroughly rinse your body with warm water from the neck down.  8.  DO NOT shower/wash with your normal soap after using and rinsing off  the CHG Soap.                9.  Pat yourself dry with a clean towel.            10.  Wear clean pajamas.            11.  Place clean sheets on your bed the night of your first shower and do not  sleep with pets. Day of Surgery : Do not apply any lotions/deodorants the morning of surgery.  Please wear clean clothes to the hospital/surgery center.  FAILURE TO FOLLOW THESE INSTRUCTIONS MAY RESULT IN THE CANCELLATION OF YOUR SURGERY PATIENT SIGNATURE_________________________________  NURSE SIGNATURE__________________________________  ________________________________________________________________________

## 2020-09-27 NOTE — Progress Notes (Addendum)
Anesthesia Review:  PCP: DR Cicero Duck  LOV with Christy Sartorius 07/27/20  Cardiologist :  DR Berniece Salines [place] 08/08/20  Chest x-ray : 03/31/20  EKG : 01/19/20  Echo : 10/26/19  Stress test: Cardiac Cath :  Activity level: can do a flgiht of stairs without difficulty  Sleep Study/ CPAP : none  Fasting Blood Sugar :      / Checks Blood Sugar -- times a day:   Blood Thinner/ Instructions /Last Dose: ASA / Instructions/ Last Dose :

## 2020-10-01 ENCOUNTER — Encounter: Payer: Self-pay | Admitting: Orthopaedic Surgery

## 2020-10-03 ENCOUNTER — Other Ambulatory Visit (HOSPITAL_COMMUNITY)
Admission: RE | Admit: 2020-10-03 | Discharge: 2020-10-03 | Disposition: A | Discharge status: Home / Self Care | Payer: Medicaid Other | Source: Ambulatory Visit | Attending: Orthopaedic Surgery | Admitting: Orthopaedic Surgery

## 2020-10-03 DIAGNOSIS — Z01812 Encounter for preprocedural laboratory examination: Secondary | ICD-10-CM | POA: Diagnosis not present

## 2020-10-03 DIAGNOSIS — Z20822 Contact with and (suspected) exposure to covid-19: Secondary | ICD-10-CM | POA: Diagnosis not present

## 2020-10-03 LAB — SARS CORONAVIRUS 2 (TAT 6-24 HRS): SARS Coronavirus 2: NEGATIVE

## 2020-10-05 NOTE — H&P (Signed)
TOTAL HIP ADMISSION H&P  Patient is admitted for left total hip arthroplasty.  Subjective:  Chief Complaint: left hip pain  HPI: Erica Welch, 35 y.o. female, has a history of pain and functional disability in the left hip(s) due to avascular necrosis and patient has failed non-surgical conservative treatments for greater than 12 weeks to include NSAID's and/or analgesics and activity modification.  Onset of symptoms was abrupt starting 1 years ago with rapidlly worsening course since that time.The patient noted no past surgery on the left hip(s).  Patient currently rates pain in the left hip at 10 out of 10 with activity. Patient has worsening of pain with activity and weight bearing, pain that interfers with activities of daily living and pain with passive range of motion. Patient has evidence of subchondral cysts by imaging studies. This condition presents safety issues increasing the risk of falls.  There is no current active infection.  Patient Active Problem List   Diagnosis Date Noted  . Vitamin D deficiency   . Vitamin B12 deficiency   . Pneumonia   . Patellofemoral arthralgia of both knees   . Migraine   . Memory loss   . Insomnia   . IBS (irritable bowel syndrome)   . Hypermobility syndrome   . Hyperlipidemia   . History of kidney stones   . GERD (gastroesophageal reflux disease)   . Dysrhythmia   . Dyspareunia due to medical condition in female   . Cancer ([place])   . Avascular necrosis of bone of left hip ([place]) 07/12/2020  . 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 Avascular necrosis of hip, left ([place]) 10/04/2019  . Avascular necrosis of hip, right ([place]) 10/04/2019  . Near syncope 10/04/2019  . Orthostatic hypotension 10/04/2019  . 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 History of 2 cesarean sections 09/01/2018  . Fibromyalgia 05/20/2017  . Acute sinusitis, unspecified 08/10/2015  . Chronic pain of left knee 07/07/2015  . Anxiety 07/05/2015  .  Arthritis, multiple joint involvement 07/05/2015  . Asthma 07/05/2015  . AV nodal tachycardia ([place]) 07/05/2015  . Endometriosis 07/05/2015  . GERD without esophagitis 07/05/2015  . Hyperlipidemia, unspecified 07/05/2015  . Irritable bowel syndrome without diarrhea 07/05/2015  . Lung nodule 07/05/2015  . Rotator cuff tendinitis 2017  . Right flank pain 2017  . Occipital neuralgia 2017  . Cubital tunnel syndrome 2017  . Fatty liver 09/07/2014  . Kidney stone 10/29/2012  . 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 Chest pain 05/20/2011  . Palpitations 05/20/2011  . SOB (shortness of breath) 05/20/2011   Past Medical History:  Diagnosis Date  . Arthritis   . Autonomic dysfunction   . AV nodal tachycardia ([place]) 2017  . Avascular necrosis of hip, left ([place])   . Cancer ([place])    uterine   . Cubital tunnel syndrome 2017  . Dyspareunia due to medical condition in female   . Dysrhythmia    SVT  . Endometriosis   . Fibromyalgia   . GERD (gastroesophageal reflux disease)   . History of kidney stones   . History of MRSA infection    2007   . Hyperlipidemia   . Hypermobility syndrome   . IBS (irritable bowel syndrome)   . Insomnia   . Memory loss   . Migraine   . Occipital neuralgia 2017  . Orthostatic hypotension   . Patellofemoral arthralgia of both knees   . Pneumonia    as a baby  . Right flank pain  2017  . Rotator cuff tendinitis 2017  . Vitamin B12 deficiency   . Vitamin D deficiency     Past Surgical History:  Procedure Laterality Date  . ABDOMINAL SURGERY  2007,2010,2012,2014  . arthroscopy right knee    . cervical dics repacement    . CESAREAN SECTION  03/19/2006  . CESAREAN SECTION  12/09/2008  . NASAL SEPTUM SURGERY  2013  . PELVIC LAPAROSCOPY  2012  . PORTA CATH INSERTION     and removal  . portacath removal     . TOTAL HIP ARTHROPLASTY Right 12/24/2019   Procedure: RIGHT TOTAL HIP ARTHROPLASTY ANTERIOR APPROACH;  Surgeon: [name], MD;   Location: [facility];  Service: Orthopedics;  Laterality: Right;  . TUBAL LIGATION N/A    and untied  . tubal ligation reversal     . TYMPANOPLASTY    . WISDOM TOOTH EXTRACTION      No current facility-administered medications for this encounter.   Current Outpatient Medications  Medication Sig Dispense Refill Last Dose  . acetaminophen (TYLENOL) 500 MG tablet Take 1,000 mg by mouth every 6 (six) hours as needed for headache or moderate pain.     Marland Kitchen ibuprofen (ADVIL) 200 MG tablet Take 600 mg by mouth every 6 (six) hours as needed for moderate pain or headache.     . ondansetron (ZOFRAN) 4 MG tablet Take 4 mg by mouth every 8 (eight) hours as needed for nausea or vomiting.     . fluticasone (FLONASE) 50 MCG/ACT nasal spray Place 2 sprays into both nostrils daily. (Patient not taking: No sig reported) 16 g 2 Completed Course at Unknown time  . meloxicam (MOBIC) 15 MG tablet Take 1 tablet daily with food for 7 days. Then take as needed. (Patient not taking: No sig reported) 40 tablet 0 Not Taking at Unknown time   Allergies  Allergen Reactions  . Latex Dermatitis    Powder in gloves  . Strawberry Extract Hives  . Adhesive [Tape] Itching    Social History   Tobacco Use  . Smoking status: Former Smoker    Years: 10.00    Types: Cigarettes  . Smokeless tobacco: Never Used  . Tobacco comment: off and on   Substance Use Topics  . Alcohol use: Yes    Comment: socially     Family History  Problem Relation Age of Onset  . Thyroid disease Mother   . Hypertension Mother   . Cancer Mother   . Hyperlipidemia Father      Review of Systems  All other systems reviewed and are negative.   Objective:  Physical Exam Vitals reviewed.  Constitutional:      Appearance: Normal appearance.  HENT:     Head: Normocephalic and atraumatic.  Eyes:     Extraocular Movements: Extraocular movements intact.     Pupils: Pupils are equal, round, and reactive to light.  Cardiovascular:     Rate and  Rhythm: Normal rate and regular rhythm.     Pulses: Normal pulses.  Pulmonary:     Effort: Pulmonary effort is normal.     Breath sounds: Normal breath sounds.  Abdominal:     Palpations: Abdomen is soft.  Musculoskeletal:     Cervical back: Normal range of motion and neck supple.     Left hip: Tenderness and bony tenderness present. Decreased strength.  Neurological:     Mental Status: She is alert and oriented to person, place, and time.  Psychiatric:  Behavior: Behavior normal.     Vital signs in last 24 hours:    Labs:   Estimated body mass index is 23.11 kg/m as calculated from the following:   Height as of 09/27/20: 5\' 8"  (1.727 m).   Weight as of 09/27/20: 68.9 kg.   Imaging Review Plain radiographs demonstrate AVN  of the left hip(s). The bone quality appears to be excellent for age and reported activity level.      Assessment/Plan:  Avascular necrosis, left hip(s)  The patient history, physical examination, clinical judgement of the provider and imaging studies are consistent with AVN of the left hip(s) and total hip arthroplasty is deemed medically necessary. The treatment options including medical management, injection therapy, arthroscopy and arthroplasty were discussed at length. The risks and benefits of total hip arthroplasty were presented and reviewed. The risks due to aseptic loosening, infection, stiffness, dislocation/subluxation,  thromboembolic complications and other imponderables were discussed.  The patient acknowledged the explanation, agreed to proceed with the plan and consent was signed. Patient is being admitted for inpatient treatment for surgery, pain control, PT, OT, prophylactic antibiotics, VTE prophylaxis, progressive ambulation and ADL's and discharge planning.The patient is planning to be discharged home with home health services

## 2020-10-06 ENCOUNTER — Observation Stay (HOSPITAL_COMMUNITY): Payer: Medicaid Other

## 2020-10-06 ENCOUNTER — Ambulatory Visit (HOSPITAL_COMMUNITY): Payer: Medicaid Other

## 2020-10-06 ENCOUNTER — Other Ambulatory Visit: Payer: Self-pay

## 2020-10-06 ENCOUNTER — Ambulatory Visit (HOSPITAL_COMMUNITY): Payer: Medicaid Other | Admitting: Physician Assistant

## 2020-10-06 ENCOUNTER — Ambulatory Visit (HOSPITAL_COMMUNITY): Payer: Medicaid Other | Admitting: Registered Nurse

## 2020-10-06 ENCOUNTER — Encounter (HOSPITAL_COMMUNITY)
Admission: RE | Disposition: A | Discharge status: Home / Self Care | Payer: Self-pay | Source: Ambulatory Visit | Attending: Orthopaedic Surgery

## 2020-10-06 ENCOUNTER — Observation Stay (HOSPITAL_COMMUNITY)
Admission: RE | Admit: 2020-10-06 | Discharge: 2020-10-07 | Disposition: A | Discharge status: Home / Self Care | Payer: Medicaid Other | Source: Ambulatory Visit | Attending: Orthopaedic Surgery | Admitting: Orthopaedic Surgery

## 2020-10-06 ENCOUNTER — Encounter (HOSPITAL_COMMUNITY): Payer: Self-pay | Admitting: Orthopaedic Surgery

## 2020-10-06 DIAGNOSIS — Z8542 Personal history of malignant neoplasm of other parts of uterus: Secondary | ICD-10-CM | POA: Diagnosis not present

## 2020-10-06 DIAGNOSIS — J45909 Unspecified asthma, uncomplicated: Secondary | ICD-10-CM | POA: Diagnosis not present

## 2020-10-06 DIAGNOSIS — M87052 Idiopathic aseptic necrosis of left femur: Secondary | ICD-10-CM | POA: Diagnosis not present

## 2020-10-06 DIAGNOSIS — E559 Vitamin D deficiency, unspecified: Secondary | ICD-10-CM | POA: Diagnosis not present

## 2020-10-06 DIAGNOSIS — Z91040 Latex allergy status: Secondary | ICD-10-CM | POA: Diagnosis not present

## 2020-10-06 DIAGNOSIS — Z87891 Personal history of nicotine dependence: Secondary | ICD-10-CM | POA: Diagnosis not present

## 2020-10-06 DIAGNOSIS — G4700 Insomnia, unspecified: Secondary | ICD-10-CM | POA: Diagnosis not present

## 2020-10-06 DIAGNOSIS — Z96642 Presence of left artificial hip joint: Secondary | ICD-10-CM | POA: Diagnosis not present

## 2020-10-06 DIAGNOSIS — Z96641 Presence of right artificial hip joint: Secondary | ICD-10-CM | POA: Diagnosis not present

## 2020-10-06 DIAGNOSIS — Z419 Encounter for procedure for purposes other than remedying health state, unspecified: Secondary | ICD-10-CM

## 2020-10-06 DIAGNOSIS — Z79899 Other long term (current) drug therapy: Secondary | ICD-10-CM | POA: Diagnosis not present

## 2020-10-06 DIAGNOSIS — E785 Hyperlipidemia, unspecified: Secondary | ICD-10-CM | POA: Diagnosis not present

## 2020-10-06 DIAGNOSIS — Z471 Aftercare following joint replacement surgery: Secondary | ICD-10-CM | POA: Diagnosis not present

## 2020-10-06 HISTORY — PX: TOTAL HIP ARTHROPLASTY: SHX124

## 2020-10-06 LAB — SURGICAL PCR SCREEN
MRSA, PCR: NEGATIVE
Staphylococcus aureus: NEGATIVE

## 2020-10-06 LAB — TYPE AND SCREEN
ABO/RH(D): O POS
Antibody Screen: NEGATIVE

## 2020-10-06 LAB — PREGNANCY, URINE: Preg Test, Ur: NEGATIVE

## 2020-10-06 SURGERY — ARTHROPLASTY, HIP, TOTAL, ANTERIOR APPROACH
Anesthesia: Spinal | Site: Hip | Laterality: Left

## 2020-10-06 MED ORDER — CEFAZOLIN SODIUM-DEXTROSE 1-4 GM/50ML-% IV SOLN
1.0000 g | Freq: Four times a day (QID) | INTRAVENOUS | Status: AC
  Administered 2020-10-06 – 2020-10-07 (×2): 1 g via INTRAVENOUS
  Filled 2020-10-06 (×2): qty 50

## 2020-10-06 MED ORDER — SODIUM CHLORIDE 0.9 % IV SOLN: INTRAVENOUS | Status: DC

## 2020-10-06 MED ORDER — DEXAMETHASONE SODIUM PHOSPHATE 4 MG/ML IJ SOLN
INTRAMUSCULAR | Status: DC | PRN
  Administered 2020-10-06: 8 mg via INTRAVENOUS

## 2020-10-06 MED ORDER — LACTATED RINGERS IV SOLN: INTRAVENOUS | Status: DC

## 2020-10-06 MED ORDER — ONDANSETRON HCL 4 MG/2ML IJ SOLN
INTRAMUSCULAR | Status: DC | PRN
  Administered 2020-10-06: 4 mg via INTRAVENOUS

## 2020-10-06 MED ORDER — METHOCARBAMOL 500 MG PO TABS
500.0000 mg | ORAL_TABLET | Freq: Four times a day (QID) | ORAL | Status: DC | PRN
  Administered 2020-10-06: 500 mg via ORAL
  Filled 2020-10-06: qty 1

## 2020-10-06 MED ORDER — METOCLOPRAMIDE HCL 5 MG/ML IJ SOLN: 5.0000 mg | Freq: Three times a day (TID) | INTRAMUSCULAR | Status: DC | PRN

## 2020-10-06 MED ORDER — ONDANSETRON HCL 4 MG/2ML IJ SOLN
INTRAMUSCULAR | Status: AC
  Filled 2020-10-06: qty 2

## 2020-10-06 MED ORDER — DEXAMETHASONE SODIUM PHOSPHATE 10 MG/ML IJ SOLN
INTRAMUSCULAR | Status: AC
  Filled 2020-10-06: qty 1

## 2020-10-06 MED ORDER — BUPIVACAINE IN DEXTROSE 0.75-8.25 % IT SOLN
INTRATHECAL | Status: DC | PRN
  Administered 2020-10-06: 1.6 mL via INTRATHECAL

## 2020-10-06 MED ORDER — OXYCODONE HCL 5 MG PO TABS: 5.0000 mg | ORAL_TABLET | Freq: Once | ORAL | Status: DC | PRN

## 2020-10-06 MED ORDER — DIPHENHYDRAMINE HCL 12.5 MG/5ML PO ELIX: 12.5000 mg | ORAL_SOLUTION | ORAL | Status: DC | PRN

## 2020-10-06 MED ORDER — TRANEXAMIC ACID-NACL 1000-0.7 MG/100ML-% IV SOLN
INTRAVENOUS | Status: AC
  Filled 2020-10-06: qty 100

## 2020-10-06 MED ORDER — ZOLPIDEM TARTRATE 5 MG PO TABS: 5.0000 mg | ORAL_TABLET | Freq: Every evening | ORAL | Status: DC | PRN

## 2020-10-06 MED ORDER — PROPOFOL 10 MG/ML IV BOLUS
INTRAVENOUS | Status: DC | PRN
  Administered 2020-10-06: 30 mg via INTRAVENOUS
  Administered 2020-10-06 (×3): 20 mg via INTRAVENOUS
  Administered 2020-10-06: 10 mg via INTRAVENOUS

## 2020-10-06 MED ORDER — POVIDONE-IODINE 10 % EX SWAB
2.0000 "application " | Freq: Once | CUTANEOUS | Status: AC
  Administered 2020-10-06: 2 via TOPICAL

## 2020-10-06 MED ORDER — DOCUSATE SODIUM 100 MG PO CAPS
100.0000 mg | ORAL_CAPSULE | Freq: Two times a day (BID) | ORAL | Status: DC
  Administered 2020-10-06 – 2020-10-07 (×2): 100 mg via ORAL
  Filled 2020-10-06 (×2): qty 1

## 2020-10-06 MED ORDER — ASPIRIN 81 MG PO CHEW: 81.0000 mg | CHEWABLE_TABLET | Freq: Two times a day (BID) | ORAL | 0 refills | Status: AC

## 2020-10-06 MED ORDER — HYDROMORPHONE HCL 1 MG/ML IJ SOLN: 0.5000 mg | INTRAMUSCULAR | Status: DC | PRN

## 2020-10-06 MED ORDER — PHENYLEPHRINE HCL-NACL 20-0.9 MG/250ML-% IV SOLN
INTRAVENOUS | Status: DC | PRN
  Administered 2020-10-06: 10 ug/min via INTRAVENOUS

## 2020-10-06 MED ORDER — PHENOL 1.4 % MT LIQD: 1.0000 | OROMUCOSAL | Status: DC | PRN

## 2020-10-06 MED ORDER — SODIUM CHLORIDE 0.9 % IR SOLN
Status: DC | PRN
  Administered 2020-10-06: 1000 mL

## 2020-10-06 MED ORDER — ALUM & MAG HYDROXIDE-SIMETH 200-200-20 MG/5ML PO SUSP: 30.0000 mL | ORAL | Status: DC | PRN

## 2020-10-06 MED ORDER — OXYCODONE HCL 5 MG/5ML PO SOLN: 5.0000 mg | Freq: Once | ORAL | Status: DC | PRN

## 2020-10-06 MED ORDER — PROMETHAZINE HCL 25 MG/ML IJ SOLN: 6.2500 mg | INTRAMUSCULAR | Status: DC | PRN

## 2020-10-06 MED ORDER — PROPOFOL 500 MG/50ML IV EMUL
INTRAVENOUS | Status: DC | PRN
  Administered 2020-10-06: 100 ug/kg/min via INTRAVENOUS
  Administered 2020-10-06: 40 ug/kg/min via INTRAVENOUS

## 2020-10-06 MED ORDER — MENTHOL 3 MG MT LOZG: 1.0000 | LOZENGE | OROMUCOSAL | Status: DC | PRN

## 2020-10-06 MED ORDER — FENTANYL CITRATE (PF) 100 MCG/2ML IJ SOLN
INTRAMUSCULAR | Status: DC | PRN
  Administered 2020-10-06: 50 ug via INTRAVENOUS

## 2020-10-06 MED ORDER — METOCLOPRAMIDE HCL 5 MG PO TABS: 5.0000 mg | ORAL_TABLET | Freq: Three times a day (TID) | ORAL | Status: DC | PRN

## 2020-10-06 MED ORDER — CHLORHEXIDINE GLUCONATE 0.12 % MT SOLN
15.0000 mL | Freq: Once | OROMUCOSAL | Status: AC
  Administered 2020-10-06: 15 mL via OROMUCOSAL

## 2020-10-06 MED ORDER — ORAL CARE MOUTH RINSE: 15.0000 mL | Freq: Once | OROMUCOSAL | Status: AC

## 2020-10-06 MED ORDER — OXYCODONE HCL 5 MG PO TABS: 5.0000 mg | ORAL_TABLET | ORAL | 0 refills | Status: DC | PRN

## 2020-10-06 MED ORDER — MIDAZOLAM HCL 5 MG/5ML IJ SOLN
INTRAMUSCULAR | Status: DC | PRN
  Administered 2020-10-06: 2 mg via INTRAVENOUS

## 2020-10-06 MED ORDER — ACETAMINOPHEN 325 MG PO TABS
325.0000 mg | ORAL_TABLET | Freq: Four times a day (QID) | ORAL | Status: DC | PRN
Start: 2020-10-07 — End: 2020-10-07

## 2020-10-06 MED ORDER — FENTANYL CITRATE (PF) 100 MCG/2ML IJ SOLN: 25.0000 ug | INTRAMUSCULAR | Status: DC | PRN

## 2020-10-06 MED ORDER — OXYCODONE HCL 5 MG PO TABS
10.0000 mg | ORAL_TABLET | ORAL | Status: DC | PRN
  Administered 2020-10-06 – 2020-10-07 (×2): 10 mg via ORAL
  Filled 2020-10-06: qty 2

## 2020-10-06 MED ORDER — ONDANSETRON HCL 4 MG/2ML IJ SOLN: 4.0000 mg | Freq: Four times a day (QID) | INTRAMUSCULAR | Status: DC | PRN

## 2020-10-06 MED ORDER — METHOCARBAMOL 500 MG PO TABS: 500.0000 mg | ORAL_TABLET | Freq: Four times a day (QID) | ORAL | 1 refills | Status: AC | PRN

## 2020-10-06 MED ORDER — ASPIRIN 81 MG PO CHEW
81.0000 mg | CHEWABLE_TABLET | Freq: Two times a day (BID) | ORAL | Status: DC
  Administered 2020-10-06 – 2020-10-07 (×2): 81 mg via ORAL
  Filled 2020-10-06 (×2): qty 1

## 2020-10-06 MED ORDER — PANTOPRAZOLE SODIUM 40 MG PO TBEC
40.0000 mg | DELAYED_RELEASE_TABLET | Freq: Every day | ORAL | Status: DC
  Administered 2020-10-06 – 2020-10-07 (×2): 40 mg via ORAL
  Filled 2020-10-06 (×2): qty 1

## 2020-10-06 MED ORDER — OXYCODONE HCL 5 MG PO TABS
5.0000 mg | ORAL_TABLET | ORAL | Status: DC | PRN
  Administered 2020-10-06 – 2020-10-07 (×2): 10 mg via ORAL
  Filled 2020-10-06 (×3): qty 2

## 2020-10-06 MED ORDER — MIDAZOLAM HCL 2 MG/2ML IJ SOLN
INTRAMUSCULAR | Status: AC
  Filled 2020-10-06: qty 2

## 2020-10-06 MED ORDER — 0.9 % SODIUM CHLORIDE (POUR BTL) OPTIME
TOPICAL | Status: DC | PRN
  Administered 2020-10-06: 1000 mL

## 2020-10-06 MED ORDER — TRANEXAMIC ACID-NACL 1000-0.7 MG/100ML-% IV SOLN
1000.0000 mg | INTRAVENOUS | Status: AC
  Administered 2020-10-06: 1000 mg via INTRAVENOUS

## 2020-10-06 MED ORDER — ONDANSETRON HCL 4 MG PO TABS: 4.0000 mg | ORAL_TABLET | Freq: Four times a day (QID) | ORAL | Status: DC | PRN

## 2020-10-06 MED ORDER — FENTANYL CITRATE (PF) 100 MCG/2ML IJ SOLN
INTRAMUSCULAR | Status: AC
  Filled 2020-10-06: qty 2

## 2020-10-06 MED ORDER — ACETAMINOPHEN 500 MG PO TABS
1000.0000 mg | ORAL_TABLET | Freq: Once | ORAL | Status: AC
  Administered 2020-10-06: 1000 mg via ORAL
  Filled 2020-10-06: qty 2

## 2020-10-06 MED ORDER — CEFAZOLIN SODIUM-DEXTROSE 2-4 GM/100ML-% IV SOLN
2.0000 g | INTRAVENOUS | Status: AC
  Administered 2020-10-06: 2 g via INTRAVENOUS
  Filled 2020-10-06: qty 100

## 2020-10-06 MED ORDER — PROPOFOL 1000 MG/100ML IV EMUL
INTRAVENOUS | Status: AC
  Filled 2020-10-06: qty 100

## 2020-10-06 MED ORDER — METHOCARBAMOL 500 MG IVPB - SIMPLE MED
500.0000 mg | Freq: Four times a day (QID) | INTRAVENOUS | Status: DC | PRN
  Filled 2020-10-06: qty 50

## 2020-10-06 SURGICAL SUPPLY — 33 items
BALL HIP CERAMIC (Hips) ×1 IMPLANT
BLADE SAW SGTL 18X1.27X75 (BLADE) ×2 IMPLANT
COVER PERINEAL POST (MISCELLANEOUS) ×2 IMPLANT
COVER SURGICAL LIGHT HANDLE (MISCELLANEOUS) ×2 IMPLANT
CUP SECTOR GRIPTON 50MM (Cup) ×2 IMPLANT
DRAPE STERI IOBAN 125X83 (DRAPES) ×2 IMPLANT
DRAPE U-SHAPE 47X51 STRL (DRAPES) ×4 IMPLANT
DRSG AQUACEL AG ADV 3.5X10 (GAUZE/BANDAGES/DRESSINGS) ×2 IMPLANT
DURAPREP 26ML APPLICATOR (WOUND CARE) ×2 IMPLANT
ELECT REM PT RETURN 15FT ADLT (MISCELLANEOUS) ×2 IMPLANT
GAUZE XEROFORM 1X8 LF (GAUZE/BANDAGES/DRESSINGS) ×2 IMPLANT
GLOVE SRG 8 PF TXTR STRL LF DI (GLOVE) ×2 IMPLANT
GLOVE SURG ENC MOIS LTX SZ7.5 (GLOVE) ×2 IMPLANT
GLOVE SURG LTX SZ8 (GLOVE) ×2 IMPLANT
GLOVE SURG UNDER POLY LF SZ8 (GLOVE) ×2
GOWN STRL REUS W/TWL XL LVL3 (GOWN DISPOSABLE) ×4 IMPLANT
HANDPIECE INTERPULSE COAX TIP (DISPOSABLE) ×1
HIP BALL CERAMIC (Hips) ×2 IMPLANT
HOLDER FOLEY CATH W/STRAP (MISCELLANEOUS) ×2 IMPLANT
KIT TURNOVER KIT A (KITS) ×2 IMPLANT
LINER ACETABULAR 32X50 (Liner) ×2 IMPLANT
PACK ANTERIOR HIP CUSTOM (KITS) ×2 IMPLANT
SET HNDPC FAN SPRY TIP SCT (DISPOSABLE) ×1 IMPLANT
STAPLER VISISTAT 35W (STAPLE) ×2 IMPLANT
STEM CORAIL KA11 (Stem) ×2 IMPLANT
SUT ETHIBOND NAB CT1 #1 30IN (SUTURE) ×2 IMPLANT
SUT ETHILON 2 0 PS N (SUTURE) IMPLANT
SUT MNCRL AB 4-0 PS2 18 (SUTURE) IMPLANT
SUT VIC AB 0 CT1 36 (SUTURE) ×2 IMPLANT
SUT VIC AB 1 CT1 36 (SUTURE) ×2 IMPLANT
SUT VIC AB 2-0 CT1 27 (SUTURE) ×2
SUT VIC AB 2-0 CT1 TAPERPNT 27 (SUTURE) ×2 IMPLANT
TRAY FOLEY MTR SLVR 16FR STAT (SET/KITS/TRAYS/PACK) IMPLANT

## 2020-10-06 NOTE — Anesthesia Procedure Notes (Addendum)
Procedure Name: MAC Date/Time: 10/06/2020 1:02 PM Performed by: Victoriano Lain, CRNA Pre-anesthesia Checklist: Patient identified, Emergency Drugs available, Suction available, Patient being monitored and Timeout performed Patient Re-evaluated:Patient Re-evaluated prior to induction Oxygen Delivery Method: Simple face mask Dental Injury: Teeth and Oropharynx as per pre-operative assessment

## 2020-10-06 NOTE — Brief Op Note (Signed)
10/06/2020  2:34 PM  PATIENT:  Erica Welch  35 y.o. female  PRE-OPERATIVE DIAGNOSIS:  avascular necrosis left hip  POST-OPERATIVE DIAGNOSIS:  avascular necrosis left hip  PROCEDURE:  Procedure(s): LEFT TOTAL HIP ARTHROPLASTY ANTERIOR APPROACH (Left)  SURGEON:  Surgeon(s) and Role:    [name], MD - Primary  PHYSICIAN ASSISTANT:  Benita Stabile, PA-C  ANESTHESIA:   spinal  EBL:  100 mL   COUNTS:  YES  TOURNIQUET:  * No tourniquets in log *  DICTATION: .Other Dictation: Dictation Number [id number]  PLAN OF CARE: Admit for overnight observation  PATIENT DISPOSITION:  PACU - hemodynamically stable.   Delay start of Pharmacological VTE agent (>24hrs) due to surgical blood loss or risk of bleeding: no

## 2020-10-06 NOTE — Progress Notes (Signed)
Patient ID: Erica Welch, female   DOB: [birth year], 35 y.o.   MRN: [id number] I have reviewed the patient's post-op x-rays and her hip replacements are stable including the left one done today.  She can WBAT.  No evidence of fracture.

## 2020-10-06 NOTE — Anesthesia Preprocedure Evaluation (Addendum)
Anesthesia Evaluation  Patient identified by MRN, date of birth, ID band Patient awake    Reviewed: Allergy & Precautions, NPO status , Patient's Chart, lab work & pertinent test results  History of Anesthesia Complications Negative for: history of anesthetic complications  Airway Mallampati: I  TM Distance: >3 FB Neck ROM: Full    Dental no notable dental hx.    Pulmonary asthma , former smoker,    Pulmonary exam normal        Cardiovascular Normal cardiovascular exam+ dysrhythmias Supra Ventricular Tachycardia      Neuro/Psych  Headaches, Anxiety    GI/Hepatic Neg liver ROS, GERD  Controlled,  Endo/Other  negative endocrine ROS  Renal/GU negative Renal ROS  negative genitourinary   Musculoskeletal  (+) Arthritis , Fibromyalgia -Avascular necrosis left hip   Abdominal   Peds  Hematology negative hematology ROS (+)   Anesthesia Other Findings Day of surgery medications reviewed with patient.  Reproductive/Obstetrics negative OB ROS                            Anesthesia Physical Anesthesia Plan  ASA: II  Anesthesia Plan: Spinal   Post-op Pain Management:    Induction:   PONV Risk Score and Plan: 3 and Treatment may vary due to age or medical condition, Ondansetron, Propofol infusion, Dexamethasone and Midazolam  Airway Management Planned: Natural Airway and Simple Face Mask  Additional Equipment: None  Intra-op Plan:   Post-operative Plan:   Informed Consent: I have reviewed the patients History and Physical, chart, labs and discussed the procedure including the risks, benefits and alternatives for the proposed anesthesia with the patient or authorized representative who has indicated his/her understanding and acceptance.       Plan Discussed with: CRNA  Anesthesia Plan Comments:        Anesthesia Quick Evaluation

## 2020-10-06 NOTE — Interval H&P Note (Signed)
History and Physical Interval Note:   The patient is here today for a left total hip arthroplasty to treat her left hip avascular necrosis.  Having had the surgery before on the right side she is fully aware of the risk and benefits of surgery.  There has been no acute or interval change in her medical status.  Please see recent H&P.  The risks and benefits of surgery been explained in detail and informed consent is obtained.  The left hip has been marked.  10/06/2020 11:39 AM  Erica Welch  has presented today for surgery, with the diagnosis of avascular necrosis left hip.  The various methods of treatment have been discussed with the patient and family. After consideration of risks, benefits and other options for treatment, the patient has consented to  Procedure(s): LEFT TOTAL HIP ARTHROPLASTY ANTERIOR APPROACH (Left) as a surgical intervention.  The patient's history has been reviewed, patient examined, no change in status, stable for surgery.  I have reviewed the patient's chart and labs.  Questions were answered to the patient's satisfaction.     [name]

## 2020-10-06 NOTE — Anesthesia Procedure Notes (Signed)
Spinal  Start time: 10/06/2020 1:15 PM End time: 10/06/2020 1:12 PM Reason for block: surgical anesthesia Staffing Resident/CRNA: Victoriano Lain, CRNA Spinal Block Patient position: sitting Prep: DuraPrep and site prepped and draped Patient monitoring: heart rate, continuous pulse ox and blood pressure Approach: midline Location: L3-4 Injection technique: single-shot Needle Needle type: Pencan  Needle gauge: 24 G Needle length: 10 cm Assessment Sensory level: T6 Events: CSF return Additional Notes Pt placed in sitting position for spinal placement. Spinal kit expiration date checked and verified. One attempt by CRNA. +clear CSF, -heme. Pt tolerated procedure well.

## 2020-10-06 NOTE — Transfer of Care (Signed)
Immediate Anesthesia Transfer of Care Note  Patient: Erica Welch  Procedure(s) Performed: LEFT TOTAL HIP ARTHROPLASTY ANTERIOR APPROACH (Left Hip)  Patient Location: PACU  Anesthesia Type:Spinal  Level of Consciousness: sedated  Airway & Oxygen Therapy: Patient Spontanous Breathing and Patient connected to face mask oxygen  Post-op Assessment: Report given to RN and Post -op Vital signs reviewed and stable  Post vital signs: Reviewed and stable  Last Vitals:  Vitals Value Taken Time  BP 84/49 10/06/20 1500  Temp    Pulse 57 10/06/20 1501  Resp 13 10/06/20 1501  SpO2 100 % 10/06/20 1501  Vitals shown include unvalidated device data.  Last Pain:  Vitals:   10/06/20 1016  TempSrc: Oral         Complications: No complications documented.

## 2020-10-07 DIAGNOSIS — J45909 Unspecified asthma, uncomplicated: Secondary | ICD-10-CM | POA: Diagnosis not present

## 2020-10-07 DIAGNOSIS — Z91040 Latex allergy status: Secondary | ICD-10-CM | POA: Diagnosis not present

## 2020-10-07 DIAGNOSIS — M87052 Idiopathic aseptic necrosis of left femur: Secondary | ICD-10-CM | POA: Diagnosis not present

## 2020-10-07 DIAGNOSIS — Z87891 Personal history of nicotine dependence: Secondary | ICD-10-CM | POA: Diagnosis not present

## 2020-10-07 DIAGNOSIS — Z96641 Presence of right artificial hip joint: Secondary | ICD-10-CM | POA: Diagnosis not present

## 2020-10-07 DIAGNOSIS — Z79899 Other long term (current) drug therapy: Secondary | ICD-10-CM | POA: Diagnosis not present

## 2020-10-07 DIAGNOSIS — Z8542 Personal history of malignant neoplasm of other parts of uterus: Secondary | ICD-10-CM | POA: Diagnosis not present

## 2020-10-07 LAB — BASIC METABOLIC PANEL
Anion gap: 6 (ref 5–15)
BUN: 7 mg/dL (ref 6–20)
CO2: 27 mmol/L (ref 22–32)
Calcium: 8.8 mg/dL — ABNORMAL LOW (ref 8.9–10.3)
Chloride: 103 mmol/L (ref 98–111)
Creatinine, Ser: 0.5 mg/dL (ref 0.44–1.00)
GFR, Estimated: >60 mL/min (ref >60)
Glucose, Bld: 130 mg/dL — ABNORMAL HIGH (ref 70–99)
Potassium: 4.3 mmol/L (ref 3.5–5.1)
Sodium: 136 mmol/L (ref 135–145)

## 2020-10-07 LAB — CBC
HCT: 38.2 % (ref 36.0–46.0)
Hemoglobin: 13.4 g/dL (ref 12.0–15.0)
MCH: 35.3 pg — ABNORMAL HIGH (ref 26.0–34.0)
MCHC: 35.1 g/dL (ref 30.0–36.0)
MCV: 100.5 fL — ABNORMAL HIGH (ref 80.0–100.0)
Platelets: 245 10*3/uL (ref 150–400)
RBC: 3.8 MIL/uL — ABNORMAL LOW (ref 3.87–5.11)
RDW: 12.9 % (ref 11.5–15.5)
WBC: 11.9 10*3/uL — ABNORMAL HIGH (ref 4.0–10.5)
nRBC: 0 % (ref 0.0–0.2)

## 2020-10-07 LAB — GLUCOSE, CAPILLARY: Glucose-Capillary: 118 mg/dL — ABNORMAL HIGH (ref 70–99)

## 2020-10-07 NOTE — Discharge Instructions (Signed)

## 2020-10-07 NOTE — Progress Notes (Signed)
   10/07/20 1400  PT Visit Information  Last PT Received On 10/07/20  Assistance Needed +1  Pt continues to progress well with mobility, pain controlled. PT goals met, ptis motivated to d/c today with family assist prn  History of Present Illness s/p L DA THA. PMH: R DA THA 2021, fibromyalgia  Subjective Data  Patient Stated Goal home today  Precautions  Precautions Fall  Restrictions  Weight Bearing Restrictions No  Other Position/Activity Restrictions WBAT  Pain Assessment  Pain Assessment 0-10  Pain Score 3  Pain Location L hip  Pain Descriptors / Indicators Discomfort;Sore;Grimacing  Pain Intervention(s) Limited activity within patient's tolerance;Monitored during session;Premedicated before session;Repositioned  Cognition  Arousal/Alertness Awake/alert  Behavior During Therapy WFL for tasks assessed/performed  Overall Cognitive Status Within Functional Limits for tasks assessed  Bed Mobility  Overal bed mobility Needs Assistance  Bed Mobility Supine to Sit;Sit to Supine  Supine to sit Supervision  Sit to supine Supervision  General bed mobility comments incr time, able to self assist LLE with RLE  Transfers  Overall transfer level Needs assistance  Equipment used Rolling walker (2 wheeled)  Transfers Sit to/from Stand  Sit to Stand Supervision;Modified independent (Device/Increase time)  General transfer comment no physical assist, pt return demo's correct hand placement  Ambulation/Gait  Ambulation/Gait assistance Supervision;Modified independent (Device/Increase time)  Gait Distance (Feet) 15 Feet (x2)  Assistive device Rolling walker (2 wheeled)  Gait Pattern/deviations Step-through pattern  General Gait Details pt with good gait stability, lightly reliant on RW, improved wt shift with incr distance  Total Joint Exercises  Ankle Circles/Pumps AROM;Both;10 reps  Quad Sets AROM;Both;10 reps  Short Arc Quad AROM;Left;10 reps  Heel Slides AAROM;Left;10 reps  Hip  ABduction/ADduction AAROM;AROM;Left;10 reps  PT - End of Session  Equipment Utilized During Treatment Gait belt  Activity Tolerance Patient tolerated treatment well  Patient left in bed;with call bell/phone within reach;with family/visitor present   PT - Assessment/Plan  PT Plan Current plan remains appropriate  PT Visit Diagnosis Difficulty in walking, not elsewhere classified (R26.2)  PT Frequency (ACUTE ONLY) 7X/week  Follow Up Recommendations Follow surgeon's recommendation for DC plan and follow-up therapies  PT equipment None recommended by PT  AM-PAC PT "6 Clicks" Mobility Outcome Measure (Version 2)  Help needed turning from your back to your side while in a flat bed without using bedrails? 4  Help needed moving from lying on your back to sitting on the side of a flat bed without using bedrails? 4  Help needed moving to and from a bed to a chair (including a wheelchair)? 4  Help needed standing up from a chair using your arms (e.g., wheelchair or bedside chair)? 4  Help needed to walk in hospital room? 4  Help needed climbing 3-5 steps with a railing?  3  6 Click Score 23  Consider Recommendation of Discharge To: Home with no services  PT Goal Progression  Progress towards PT goals Progressing toward goals  Acute Rehab PT Goals  PT Goal Formulation With patient  Time For Goal Achievement 10/12/20  Potential to Achieve Goals Good  PT Time Calculation  PT Start Time (ACUTE ONLY) 1311  PT Stop Time (ACUTE ONLY) 1331  PT Time Calculation (min) (ACUTE ONLY) 20 min  PT General Charges  $$ ACUTE PT VISIT 1 Visit  PT Treatments  $Therapeutic Exercise 8-22 mins

## 2020-10-07 NOTE — TOC Transition Note (Signed)
Transition of Care [facility]) - CM/SW Discharge Note   Patient Details  Name: Erica Welch MRN: [id number] Date of Birth: Jun 04, 1985  Transition of Care [facility]) CM/SW Contact:  Ross Ludwig, LCSW Phone Number: 10/07/2020, 1:17 PM   Clinical Narrative:     Patient will be going home with home health through [facility].  CSW signing off please reconsult with any other social work needs, home health agency has been notified of planned discharge.   Final next level of care: [place] Barriers to Discharge: Barriers Resolved   Patient Goals and CMS Choice Patient states their goals for this hospitalization and ongoing recovery are:: To return back home with home health services. CMS Medicare.gov Compare Post Acute Care list provided to:: Patient Choice offered to / list presented to : Patient  Discharge Placement                       Discharge Plan and Services                DME Arranged: 3-N-1,Walker rolling   Date DME Agency Contacted: 10/06/20 Time DME Agency Contacted: [phone]   [place]: PT [place]: [place] Determinants of Health (SDOH) Interventions     Readmission Risk Interventions No flowsheet data found.

## 2020-10-07 NOTE — Evaluation (Signed)
Physical Therapy Evaluation Patient Details Name: Erica Welch MRN: [id number] DOB: [birth year] Today's Date: 10/07/2020   History of Present Illness  s/p L DA THA. PMH: R DA THA 2021, fibromyalgia  Clinical Impression  Pt is s/p THA resulting in the deficits listed below (see PT Problem List).  Pt is doing very well today. Feels this is easier and less painful than last THA. Will see for a second session to review HEP and pt should be ready to d/c later today. She is motivated to d/c home  Pt will benefit from skilled PT to increase their independence and safety with mobility to allow discharge to the venue listed below.      Follow Up Recommendations Follow surgeon's recommendation for DC plan and follow-up therapies    Equipment Recommendations  None recommended by PT    Recommendations for Other Services       Precautions / Restrictions Precautions Precautions: Fall Restrictions Weight Bearing Restrictions: No      Mobility  Bed Mobility Overal bed mobility: Needs Assistance Bed Mobility: Supine to Sit     Supine to sit: Supervision     General bed mobility comments: for safety, no physical assist    Transfers Overall transfer level: Needs assistance Equipment used: Rolling walker (2 wheeled) Transfers: Sit to/from Stand Sit to Stand: Supervision         General transfer comment: cues for hand placement, pt self corrects  Ambulation/Gait Ambulation/Gait assistance: Supervision Gait Distance (Feet): 200 Feet Assistive device: Rolling walker (2 wheeled) Gait Pattern/deviations: Step-through pattern     General Gait Details: pt with good gait stability, lightly reliant on RW, improved wt shift with incr distance  Stairs            Wheelchair Mobility    Modified Rankin (Stroke Patients Only)       Balance                                             Pertinent Vitals/Pain Pain Assessment: 0-10 Pain Score: 6  Pain  Location: L hip Pain Descriptors / Indicators: Discomfort;Sore;Grimacing Pain Intervention(s): Limited activity within patient's tolerance;Monitored during session;Repositioned;Premedicated before session    Home Living Family/patient expects to be discharged to:: Private residence Living Arrangements: Spouse/significant other;Children Available Help at Discharge: Family Type of Home: House Home Access: Level entry     Home Layout: Able to live on main level with bedroom/bathroom Home Equipment: Environmental consultant - 2 wheels;Cane - single point;Crutches;Tub bench      Prior Function Level of Independence: Independent               Hand Dominance        Extremity/Trunk Assessment   Upper Extremity Assessment Upper Extremity Assessment: Overall WFL for tasks assessed    Lower Extremity Assessment Lower Extremity Assessment: LLE deficits/detail LLE Deficits / Details: AROM grossly WFL, slight limitiation with end range hip flexion d/t pain. 3/5 hip otherwise grossly WFL       Communication   Communication: No difficulties  Cognition Arousal/Alertness: Awake/alert Behavior During Therapy: WFL for tasks assessed/performed Overall Cognitive Status: Within Functional Limits for tasks assessed                                        General  Comments      Exercises     Assessment/Plan    PT Assessment Patient needs continued PT services  PT Problem List Pain;Decreased knowledge of use of DME;Decreased mobility       PT Treatment Interventions DME instruction;Therapeutic exercise;Gait training;Functional mobility training;Therapeutic activities;Patient/family education    PT Goals (Current goals can be found in the Care Plan section)  Acute Rehab PT Goals Patient Stated Goal: home today PT Goal Formulation: With patient Time For Goal Achievement: 10/12/20 Potential to Achieve Goals: Good    Frequency 7X/week   Barriers to discharge         Co-evaluation               AM-PAC PT "6 Clicks" Mobility  Outcome Measure Help needed turning from your back to your side while in a flat bed without using bedrails?: None Help needed moving from lying on your back to sitting on the side of a flat bed without using bedrails?: None Help needed moving to and from a bed to a chair (including a wheelchair)?: None Help needed standing up from a chair using your arms (e.g., wheelchair or bedside chair)?: None Help needed to walk in hospital room?: A Little Help needed climbing 3-5 steps with a railing? : A Little 6 Click Score: 22    End of Session Equipment Utilized During Treatment: Gait belt Activity Tolerance: Patient tolerated treatment well Patient left: with call bell/phone within reach;in chair;with chair alarm set Nurse Communication: Mobility status PT Visit Diagnosis: Difficulty in walking, not elsewhere classified (R26.2)    Time: 2694-8546 PT Time Calculation (min) (ACUTE ONLY): 16 min   Charges:   PT Evaluation $PT Eval Low Complexity: [place], PT  Acute Rehab Dept ([place]) [phone] Pager [phone]  10/07/2020   [facility] 10/07/2020, 12:23 PM

## 2020-10-07 NOTE — Op Note (Signed)
Erica Welch, TROCHEZ MEDICAL RECORD NO: [id number] ACCOUNT NO: [id number] DATE OF BIRTH: 11/01/1985 FACILITY: [facility]: [id number] PHYSICIAN: Lind Guest. Ninfa Linden, MD  Operative Report   DATE OF PROCEDURE: 10/06/2020  PREOPERATIVE DIAGNOSIS:  Avascular necrosis, left hip.  POSTOPERATIVE DIAGNOSIS:  Avascular necrosis, left hip.  PROCEDURE:  Left total hip arthroplasty through direct anterior approach.  IMPLANTS:  [employer] Gription acetabular component size 50, size 32+0 neutral polyethylene liner, size 11 Corail femoral component with standard offset, size 32+5 ceramic hip ball.  SURGEON:  Lind Guest. Ninfa Linden, MD.  ASSISTANT:  Erskine Emery, PA-C.  ANESTHESIA:  Spinal.  ANTIBIOTICS:  2 grams IV Ancef.  ESTIMATED BLOOD LOSS:  696 mL  COMPLICATIONS:  None.  INDICATIONS:  The patient is a 35 year old female well known to me.  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  We replaced her right hip in August of last year.  Her left hip pain has worsened and has become daily with mobility and  weightbearing.  The MRI of her left hip also confirmed avascular necrosis of the left hip.  Given the failure of conservative treatment, we have recommended hip replacement on the left side as well and she agrees with this having had this done on the  right side.  She is fully aware of the risk of acute blood loss anemia, nerve or vessel injury, fracture, infection, DVT, dislocation, implant failure and skin and soft tissue issues.  She understands our goals are to decrease pain, improve mobility and  overall improve quality of life.  DESCRIPTION OF PROCEDURE:  After informed consent was obtained, and appropriate left hip was marked, she was brought to the operating room and sat up on the stretcher where spinal anesthesia was obtained.  She was laid in supine position on the  stretcher.  A Foley catheter was placed.  I was able to assess her leg length and placed traction boots  on both her feet.  She was placed supine on the Hana fracture table, the perineal post in place and both legs in line skeletal traction devices.  No  traction applied.  Of note, she is actually a touch shorter on this side.  A timeout was called and she was identified as correct patient, correct left hip.  I then made an incision just inferior and posterior to the anterior superior iliac spine and  carried this obliquely down the leg.  We dissected down tensor fascia lata muscle.  Tensor fascia was then divided longitudinally to proceed with direct anterior approach to the hip.  We identified and cauterized circumflex vessels, identified the hip  capsule, opened up the hip capsule in L-type format finding moderate joint effusion and significant fibrillation of the cartilage of the femoral head from avascular necrosis.  We made a femoral neck cut with an oscillating saw just proximal to the lesser  trochanter and completed this with an osteotome.  We placed a corkscrew guide and removed the femoral head in its entirety and the cartilage was easily to flake off from avascular necrosis.  I then removed remnants of the acetabular labrum and other  debris and placed a bent Hohmann over the medial acetabular rim and then began reaming under direct visualization from a size 44 reamer in stepwise increments going up to a size 50 with all reamers placed under direct visualization, the last reamer was  placed under direct fluoroscopy, so I could obtain our depth of reaming, our inclination and anteversion.  I then placed the  Runner, broadcasting/film/video Gription acetabular component, size 50, went with a 32+0 neutral polyethylene liner.  Attention was then turned  to the femur.  With the leg externally rotated 120 degrees and adducted, we were able to place a Hohmann retractor medially, and a long Hohmann retractor behind the greater trochanter.  We released lateral joint capsule and used a box-cutting osteotome  to enter the  femoral canal and a rongeur to lateralize.  I then began broaching using the Corail broaching system from a size 8 going up to a size 11.  With a size 11 in place, we trialed a standard offset femoral neck and a 32+1 hip ball and reduced  this in the acetabulum.  I felt like we needed just a little bit more leg length.  We dislocated the hip, removed the trial components.  I placed the real Corail femoral component size 11 with standard offset and the real 32+5 ceramic hip ball and again  reduced this in acetabulum and it was stable.  We assessed this radiographically and mechanically.  We then irrigated the soft tissue with normal saline solution and closed the joint capsule with interrupted #1 Ethibond suture.  #1 Vicryl was used to  close the tensor fascia,  0 Vicryl was used to close deep tissue, 2-0 Vicryl was used to close subcutaneous tissue and a 4-0 Monocryl subcuticular stitch was placed.  She was taken off the Hana table and taken to recovery room in stable condition with  all final counts being correct.  No complications noted.  Of note, Benita Stabile, PA-C, assisted in the entire case.  His assistance was needed throughout every aspect of this case.   PAA D: 10/06/2020 2:32:19 pm T: 10/07/2020 12:34:00 am  JOB: [id number]/ [id number]

## 2020-10-07 NOTE — Progress Notes (Addendum)
     Subjective: 1 Day Post-Op Procedure(s) (LRB): LEFT TOTAL HIP ARTHROPLASTY ANTERIOR APPROACH (Left) Awake, alert and oriented x 4. AVN likely due to steroid exposure. Post right THR in the past now immediately post op left  THR.  Patient reports pain as moderate.    Objective:   VITALS:  Temp:  [97.7 F (36.5 C)-99.3 F (37.4 C)] 98.2 F (36.8 C) (05/28 0533) Pulse Rate:  [41-90] 53 (05/28 0533) Resp:  [12-20] 16 (05/28 0533) BP: (84-153)/(48-94) 133/77 (05/28 0533) SpO2:  [94 %-100 %] 99 % (05/28 0533)  Neurologically intact ABD soft Neurovascular intact Sensation intact distally Intact pulses distally Dorsiflexion/Plantar flexion intact Incision: dressing C/D/I and no drainage   LABS Recent Labs    10/07/20 0340  HGB 13.4  WBC 11.9*  PLT 245   Recent Labs    10/07/20 0340  NA 136  K 4.3  CL 103  CO2 27  BUN 7  CREATININE 0.50  GLUCOSE 130*   No results for input(s): LABPT, INR in the last 72 hours.   Assessment/Plan: 1 Day Post-Op Procedure(s) (LRB): LEFT TOTAL HIP ARTHROPLASTY ANTERIOR APPROACH (Left)  Advance diet Up with therapy D/C IV fluids Discharge home after afternoon session of PT/OT as she has limited resources with medicaid, but with her previous history of right THR she should be ready for discharge today.  Basil Dess 10/07/2020, 8:58 AMPatient ID: [name], female   DOB: 04/12/1986, 35 y.o.   MRN: [id number]

## 2020-10-08 DIAGNOSIS — I471 Supraventricular tachycardia: Secondary | ICD-10-CM | POA: Diagnosis not present

## 2020-10-08 DIAGNOSIS — Z471 Aftercare following joint replacement surgery: Secondary | ICD-10-CM | POA: Diagnosis not present

## 2020-10-08 DIAGNOSIS — K589 Irritable bowel syndrome without diarrhea: Secondary | ICD-10-CM | POA: Diagnosis not present

## 2020-10-08 DIAGNOSIS — K760 Fatty (change of) liver, not elsewhere classified: Secondary | ICD-10-CM | POA: Diagnosis not present

## 2020-10-08 DIAGNOSIS — K219 Gastro-esophageal reflux disease without esophagitis: Secondary | ICD-10-CM | POA: Diagnosis not present

## 2020-10-08 DIAGNOSIS — M5481 Occipital neuralgia: Secondary | ICD-10-CM | POA: Diagnosis not present

## 2020-10-08 DIAGNOSIS — M357 Hypermobility syndrome: Secondary | ICD-10-CM | POA: Diagnosis not present

## 2020-10-08 DIAGNOSIS — M797 Fibromyalgia: Secondary | ICD-10-CM | POA: Diagnosis not present

## 2020-10-08 DIAGNOSIS — J45909 Unspecified asthma, uncomplicated: Secondary | ICD-10-CM | POA: Diagnosis not present

## 2020-10-08 DIAGNOSIS — G43909 Migraine, unspecified, not intractable, without status migrainosus: Secondary | ICD-10-CM | POA: Diagnosis not present

## 2020-10-08 DIAGNOSIS — G5620 Lesion of ulnar nerve, unspecified upper limb: Secondary | ICD-10-CM | POA: Diagnosis not present

## 2020-10-08 DIAGNOSIS — N9419 Other specified dyspareunia: Secondary | ICD-10-CM | POA: Diagnosis not present

## 2020-10-08 DIAGNOSIS — E785 Hyperlipidemia, unspecified: Secondary | ICD-10-CM | POA: Diagnosis not present

## 2020-10-09 NOTE — Anesthesia Postprocedure Evaluation (Signed)
Anesthesia Post Note  Patient: Erica Welch  Procedure(s) Performed: LEFT TOTAL HIP ARTHROPLASTY ANTERIOR APPROACH (Left Hip)     Patient location during evaluation: PACU Anesthesia Type: Spinal Level of consciousness: awake and alert and oriented Pain management: pain level controlled Vital Signs Assessment: post-procedure vital signs reviewed and stable Respiratory status: spontaneous breathing, nonlabored ventilation and respiratory function stable Cardiovascular status: blood pressure returned to baseline Postop Assessment: no apparent nausea or vomiting, spinal receding, no headache and no backache Anesthetic complications: no   No complications documented.      Brennan Bailey

## 2020-10-10 ENCOUNTER — Encounter (HOSPITAL_COMMUNITY): Payer: Self-pay | Admitting: Orthopaedic Surgery

## 2020-10-10 ENCOUNTER — Telehealth: Payer: Self-pay

## 2020-10-10 NOTE — Discharge Summary (Signed)
Patient ID: Erica Welch MRN: [id number] DOB/AGE: [birth year] 35 y.o.  Admit date: 10/06/2020 Discharge date: 10/10/2020  Admission Diagnoses:  Principal Problem:   Avascular necrosis of hip, left [facility]) Active Problems:   Status post left hip replacement   Discharge Diagnoses:  Same  Past Medical History:  Diagnosis Date  . Arthritis   . Autonomic dysfunction   . AV nodal tachycardia ([place]) 2017  . Avascular necrosis of hip, left ([place])   . Cancer ([place])    uterine   . Cubital tunnel syndrome 2017  . Dyspareunia due to medical condition in female   . Dysrhythmia    SVT  . Endometriosis   . Fibromyalgia   . GERD (gastroesophageal reflux disease)   . History of kidney stones   . History of MRSA infection    2007   . Hyperlipidemia   . Hypermobility syndrome   . IBS (irritable bowel syndrome)   . Insomnia   . Memory loss   . Migraine   . Occipital neuralgia 2017  . Orthostatic hypotension   . Patellofemoral arthralgia of both knees   . Pneumonia    as a baby  . Right flank pain 2017  . Rotator cuff tendinitis 2017  . Vitamin B12 deficiency   . Vitamin D deficiency     Surgeries: Procedure(s): LEFT TOTAL HIP ARTHROPLASTY ANTERIOR APPROACH on 10/06/2020   Consultants:   Discharged Condition: Improved  Hospital Course: Erica Welch is an 35 y.o. female who was admitted 10/06/2020 for operative treatment ofAvascular necrosis of hip, left ([place]). Patient has severe unremitting pain that affects sleep, daily activities, and work/hobbies. After pre-op clearance the patient was taken to the operating room on 10/06/2020 and underwent  Procedure(s): LEFT TOTAL HIP ARTHROPLASTY ANTERIOR APPROACH.    Patient was given perioperative antibiotics:  Anti-infectives (From admission, onward)   Start     Dose/Rate Route Frequency Ordered Stop   10/06/20 1900  ceFAZolin (ANCEF) IVPB 1 g/50 mL premix        1 g 100 mL/hr over 30 Minutes Intravenous Every 6 hours 10/06/20 1727  10/07/20 0114   10/06/20 1015  ceFAZolin (ANCEF) IVPB 2g/100 mL premix        2 g 200 mL/hr over 30 Minutes Intravenous On call to O.R. 10/06/20 1000 10/06/20 1343       Patient was given sequential compression devices, early ambulation, and chemoprophylaxis to prevent DVT.  Patient benefited maximally from hospital stay and there were no complications.    Recent vital signs: No data found.   Recent laboratory studies: No results for input(s): WBC, HGB, HCT, PLT, NA, K, CL, CO2, BUN, CREATININE, GLUCOSE, INR, CALCIUM in the last 72 hours.  Invalid input(s): PT, 2   Discharge Medications:   Allergies as of 10/07/2020      Reactions   Latex Dermatitis   Powder in gloves   Strawberry Extract Hives   Adhesive [tape] Itching      Medication List    TAKE these medications   acetaminophen 500 MG tablet Commonly known as: TYLENOL Take 1,000 mg by mouth every 6 (six) hours as needed for headache or moderate pain.   aspirin 81 MG chewable tablet Chew 1 tablet (81 mg total) by mouth 2 (two) times daily.   ibuprofen 200 MG tablet Commonly known as: ADVIL Take 600 mg by mouth every 6 (six) hours as needed for moderate pain or headache.   methocarbamol 500 MG tablet Commonly known as: ROBAXIN Take  1 tablet (500 mg total) by mouth every 6 (six) hours as needed for muscle spasms.   ondansetron 4 MG tablet Commonly known as: ZOFRAN Take 4 mg by mouth every 8 (eight) hours as needed for nausea or vomiting.   oxyCODONE 5 MG immediate release tablet Commonly known as: Oxy IR/ROXICODONE Take 1-2 tablets (5-10 mg total) by mouth every 4 (four) hours as needed for moderate pain (pain score 4-6).       Diagnostic Studies: DG Pelvis Portable  Result Date: 10/06/2020 CLINICAL DATA:  Left hip replacement. EXAM: PORTABLE PELVIS 1-2 VIEWS COMPARISON:  Included portions from pelvis and right hip exam 03/31/2020 FINDINGS: Left hip arthroplasty in expected alignment. There is a linear  lucency coursing through the femoral shaft just distal to the femoral stem does not have cortical disruption. Recent postsurgical change includes air and edema in the soft tissues and joint space. Prior right hip arthroplasty is in place. IMPRESSION: Left hip arthroplasty. Linear lucency coursing through the femoral shaft just distal to the femoral stem does not have cortical disruption, unclear if this represents a nutrient channel or a periprosthetic fracture. Electronically Signed   By: Keith Rake M.D.   On: 10/06/2020 15:23   DG C-Arm 1-60 Min-No Report  Result Date: 10/06/2020 Fluoroscopy was utilized by the requesting physician.  No radiographic interpretation.   DG HIP OPERATIVE UNILAT W OR W/O PELVIS LEFT  Result Date: 10/06/2020 CLINICAL DATA:  Elective surgery. EXAM: OPERATIVE LEFT HIP (WITH PELVIS IF PERFORMED) TECHNIQUE: Fluoroscopic spot image(s) were submitted for interpretation post-operatively. COMPARISON:  None. FINDINGS: Five fluoroscopic spot views obtained in the operating room of the pelvis and left hip during left hip arthroplasty. Interval placement of total hip arthroplasty in expected alignment. Right hip arthroplasty is artery in place. Fluoroscopy time 22 seconds. IMPRESSION: Procedural fluoroscopy during left hip arthroplasty. Electronically Signed   By: Keith Rake M.D.   On: 10/06/2020 15:19    Disposition: Discharge disposition: 01-Home or Self Care       Discharge Instructions    Call MD / Call 911   Complete by: As directed    If you experience chest pain or shortness of breath, CALL 911 and be transported to the hospital emergency room.  If you develope a fever above 101 F, pus (white drainage) or increased drainage or redness at the wound, or calf pain, call your surgeon's office.   Constipation Prevention   Complete by: As directed    Drink plenty of fluids.  Prune juice may be helpful.  You may use a stool softener, such as Colace (over the  counter) 100 mg twice a day.  Use MiraLax (over the counter) for constipation as needed.   Diet - low sodium heart healthy   Complete by: As directed    Discharge instructions   Complete by: As directed    INSTRUCTIONS AFTER JOINT REPLACEMENT   Remove items at home which could result in a fall. This includes throw rugs or furniture in walking pathways ICE to the affected joint every three hours while awake for 30 minutes at a time, for at least the first 3-5 days, and then as needed for pain and swelling.  Continue to use ice for pain and swelling. You may notice swelling that will progress down to the foot and ankle.  This is normal after surgery.  Elevate your leg when you are not up walking on it.   Continue to use the breathing machine you got in the  hospital (incentive spirometer) which will help keep your temperature down.  It is common for your temperature to cycle up and down following surgery, especially at night when you are not up moving around and exerting yourself.  The breathing machine keeps your lungs expanded and your temperature down.   DIET:  As you were doing prior to hospitalization, we recommend a well-balanced diet.  DRESSING / WOUND CARE / SHOWERING  Keep the surgical dressing until follow up.  The dressing is water proof, so you can shower without any extra covering.  IF THE DRESSING FALLS OFF or the wound gets wet inside, change the dressing with sterile gauze.  Please use good hand washing techniques before changing the dressing.  Do not use any lotions or creams on the incision until instructed by your surgeon.    ACTIVITY  Increase activity slowly as tolerated, but follow the weight bearing instructions below.   No driving for 6 weeks or until further direction given by your physician.  You cannot drive while taking narcotics.  No lifting or carrying greater than 10 lbs. until further directed by your surgeon. Avoid periods of inactivity such as sitting longer than  an hour when not asleep. This helps prevent blood clots.  You may return to work once you are authorized by your doctor.     WEIGHT BEARING   Weight bearing as tolerated with assist device (walker, cane, etc) as directed, use it as long as suggested by your surgeon or therapist, typically at least 4-6 weeks.   EXERCISES  Results after joint replacement surgery are often greatly improved when you follow the exercise, range of motion and muscle strengthening exercises prescribed by your doctor. Safety measures are also important to protect the joint from further injury. Any time any of these exercises cause you to have increased pain or swelling, decrease what you are doing until you are comfortable again and then slowly increase them. If you have problems or questions, call your caregiver or physical therapist for advice.   Rehabilitation is important following a joint replacement. After just a few days of immobilization, the muscles of the leg can become weakened and shrink (atrophy).  These exercises are designed to build up the tone and strength of the thigh and leg muscles and to improve motion. Often times heat used for twenty to thirty minutes before working out will loosen up your tissues and help with improving the range of motion but do not use heat for the first two weeks following surgery (sometimes heat can increase post-operative swelling).   These exercises can be done on a training (exercise) mat, on the floor, on a table or on a bed. Use whatever works the best and is most comfortable for you.    Use music or television while you are exercising so that the exercises are a pleasant break in your day. This will make your life better with the exercises acting as a break in your routine that you can look forward to.   Perform all exercises about fifteen times, three times per day or as directed.  You should exercise both the operative leg and the other leg as well.  Exercises include:    Quad Sets - Tighten up the muscle on the front of the thigh (Quad) and hold for 5-10 seconds.   Straight Leg Raises - With your knee straight (if you were given a brace, keep it on), lift the leg to 60 degrees, hold for 3 seconds, and slowly  lower the leg.  Perform this exercise against resistance later as your leg gets stronger.  Leg Slides: Lying on your back, slowly slide your foot toward your buttocks, bending your knee up off the floor (only go as far as is comfortable). Then slowly slide your foot back down until your leg is flat on the floor again.  Angel Wings: Lying on your back spread your legs to the side as far apart as you can without causing discomfort.  Hamstring Strength:  Lying on your back, push your heel against the floor with your leg straight by tightening up the muscles of your buttocks.  Repeat, but this time bend your knee to a comfortable angle, and push your heel against the floor.  You may put a pillow under the heel to make it more comfortable if necessary.   A rehabilitation program following joint replacement surgery can speed recovery and prevent re-injury in the future due to weakened muscles. Contact your doctor or a physical therapist for more information on knee rehabilitation.    CONSTIPATION  Constipation is defined medically as fewer than three stools per week and severe constipation as less than one stool per week.  Even if you have a regular bowel pattern at home, your normal regimen is likely to be disrupted due to multiple reasons following surgery.  Combination of anesthesia, postoperative narcotics, change in appetite and fluid intake all can affect your bowels.   YOU MUST use at least one of the following options; they are listed in order of increasing strength to get the job done.  They are all available over the counter, and you may need to use some, POSSIBLY even all of these options:    Drink plenty of fluids (prune juice may be helpful) and high  fiber foods Colace 100 mg by mouth twice a day  Senokot for constipation as directed and as needed Dulcolax (bisacodyl), take with full glass of water  Miralax (polyethylene glycol) once or twice a day as needed.  If you have tried all these things and are unable to have a bowel movement in the first 3-4 days after surgery call either your surgeon or your primary doctor.    If you experience loose stools or diarrhea, hold the medications until you stool forms back up.  If your symptoms do not get better within 1 week or if they get worse, check with your doctor.  If you experience "the worst abdominal pain ever" or develop nausea or vomiting, please contact the office immediately for further recommendations for treatment.   ITCHING:  If you experience itching with your medications, try taking only a single pain pill, or even half a pain pill at a time.  You can also use Benadryl over the counter for itching or also to help with sleep.   TED HOSE STOCKINGS:  Use stockings on both legs until for at least 2 weeks or as directed by physician office. They may be removed at night for sleeping.  MEDICATIONS:  See your medication summary on the "After Visit Summary" that nursing will review with you.  You may have some home medications which will be placed on hold until you complete the course of blood thinner medication.  It is important for you to complete the blood thinner medication as prescribed.  PRECAUTIONS:  If you experience chest pain or shortness of breath - call 911 immediately for transfer to the hospital emergency department.   If you develop a fever greater that 101  F, purulent drainage from wound, increased redness or drainage from wound, foul odor from the wound/dressing, or calf pain - CONTACT YOUR SURGEON.                                                   FOLLOW-UP APPOINTMENTS:  If you do not already have a post-op appointment, please call the office for an appointment to be seen by  your surgeon.  Guidelines for how soon to be seen are listed in your "After Visit Summary", but are typically between 1-4 weeks after surgery.  OTHER INSTRUCTIONS:   Knee Replacement:  Do not place pillow under knee, focus on keeping the knee straight while resting. CPM instructions: 0-90 degrees, 2 hours in the morning, 2 hours in the afternoon, and 2 hours in the evening. Place foam block, curve side up under heel at all times except when in CPM or when walking.  DO NOT modify, tear, cut, or change the foam block in any way.  POST-OPERATIVE OPIOID TAPER INSTRUCTIONS: It is important to wean off of your opioid medication as soon as possible. If you do not need pain medication after your surgery it is ok to stop day one. Opioids include: Codeine, Hydrocodone(Norco, Vicodin), Oxycodone(Percocet, oxycontin) and hydromorphone amongst others.  Long term and even short term use of opiods can cause: Increased pain response Dependence Constipation Depression Respiratory depression And more.  Withdrawal symptoms can include Flu like symptoms Nausea, vomiting And more Techniques to manage these symptoms Hydrate well Eat regular healthy meals Stay active Use relaxation techniques(deep breathing, meditating, yoga) Do Not substitute Alcohol to help with tapering If you have been on opioids for less than two weeks and do not have pain than it is ok to stop all together.  Plan to wean off of opioids This plan should start within one week post op of your joint replacement. Maintain the same interval or time between taking each dose and first decrease the dose.  Cut the total daily intake of opioids by one tablet each day Next start to increase the time between doses. The last dose that should be eliminated is the evening dose.     MAKE SURE YOU:  Understand these instructions.  Get help right away if you are not doing well or get worse.    Thank you for letting us be a part of your medical  care team.  It is a privilege we respect greatly.  We hope these instructions will help you stay on track for a fast and full recovery!     Dental Antibiotics:  In most cases prophylactic antibiotics for Dental procdeures after total joint surgery are not necessary.  Exceptions are as follows:  1. History of prior total joint infection  2. Severely immunocompromised (Organ Transplant, cancer chemotherapy, Rheumatoid biologic meds such as Fountain Cygan)  3. Poorly controlled diabetes (A1C &gt; 8.0, blood glucose over 200)  If you have one of these conditions, contact your surgeon for an antibiotic prescription, prior to your dental procedure.   Increase activity slowly as tolerated   Complete by: As directed    Post-operative opioid taper instructions:   Complete by: As directed    POST-OPERATIVE OPIOID TAPER INSTRUCTIONS: It is important to wean off of your opioid medication as soon as possible. If you do not need pain medication after your surgery  it is ok to stop day one. Opioids include: Codeine, Hydrocodone(Norco, Vicodin), Oxycodone(Percocet, oxycontin) and hydromorphone amongst others.  Long term and even short term use of opiods can cause: Increased pain response Dependence Constipation Depression Respiratory depression And more.  Withdrawal symptoms can include Flu like symptoms Nausea, vomiting And more Techniques to manage these symptoms Hydrate well Eat regular healthy meals Stay active Use relaxation techniques(deep breathing, meditating, yoga) Do Not substitute Alcohol to help with tapering If you have been on opioids for less than two weeks and do not have pain than it is ok to stop all together.  Plan to wean off of opioids This plan should start within one week post op of your joint replacement. Maintain the same interval or time between taking each dose and first decrease the dose.  Cut the total daily intake of opioids by one tablet each day Next start to  increase the time between doses. The last dose that should be eliminated is the evening dose.          Follow-up Information    [name], MD Follow up in 2 week(s).   Specialty: Orthopedic Surgery Contact information: [address] [place] [address] [phone]                Signed: Erskine Emery 10/10/2020, 10:37 AM

## 2020-10-10 NOTE — Telephone Encounter (Signed)
LMOM for patient with Dr. Trevor Mace suggestions

## 2020-10-10 NOTE — Telephone Encounter (Signed)
Anderson Malta, physical therapist with home health called the triage phone. She did patient's initial evaluation this past weekend and states that patient is reporting left hip and thigh pain. She has pain 7 out 10 at rest, despite taking pain medicine. The pain goes well over 10 with any movement of activity. Please call patient to advise on what to do. Call back [phone]  Thanks!

## 2020-10-11 DIAGNOSIS — Z419 Encounter for procedure for purposes other than remedying health state, unspecified: Secondary | ICD-10-CM | POA: Diagnosis not present

## 2020-10-18 ENCOUNTER — Encounter: Payer: Self-pay | Admitting: Orthopaedic Surgery

## 2020-10-19 ENCOUNTER — Telehealth: Payer: Self-pay | Admitting: Orthopaedic Surgery

## 2020-10-19 ENCOUNTER — Encounter: Payer: BC Managed Care – PPO | Admitting: Orthopaedic Surgery

## 2020-10-19 NOTE — Telephone Encounter (Signed)
Called and r/s for monday

## 2020-10-19 NOTE — Telephone Encounter (Signed)
Pt has a post op appt today but pt will not be able to make this afternoon. Pt called to see if we could work her in anytime this morning but Ninfa Linden and Gil's schedule are both booked for 10/19/20 and unavailable tomorrow. The best call back number is [phone].

## 2020-10-23 ENCOUNTER — Ambulatory Visit (INDEPENDENT_AMBULATORY_CARE_PROVIDER_SITE_OTHER): Payer: Medicaid Other | Admitting: Orthopaedic Surgery

## 2020-10-23 ENCOUNTER — Encounter: Payer: Self-pay | Admitting: Orthopaedic Surgery

## 2020-10-23 ENCOUNTER — Other Ambulatory Visit: Payer: Self-pay

## 2020-10-23 DIAGNOSIS — Z96642 Presence of left artificial hip joint: Secondary | ICD-10-CM

## 2020-10-23 MED ORDER — MELOXICAM 15 MG PO TABS: 15.0000 mg | ORAL_TABLET | Freq: Every day | ORAL | 3 refills | Status: DC | PRN

## 2020-10-23 MED ORDER — OXYCODONE HCL 5 MG PO TABS: 5.0000 mg | ORAL_TABLET | Freq: Four times a day (QID) | ORAL | 0 refills | Status: DC | PRN

## 2020-10-23 NOTE — Progress Notes (Signed)
The patient comes in today 2-week status post a left total hip arthroplasty.  We replaced her right hip last year.  She is only 34.  This was secondary to AVN.  She is ambling with a cane.  She is doing well overall.  She does note that there is a seroma.  We did aspirate her seroma off of her left hip postoperative as well.  She does feel like her leg lengths are more equalized.  When I have her lay supine her leg lengths are equal.  Her left hip does have a moderate seroma and I did aspirate 45 to 50 cc of fluid off of that hip.  Her old Steri-Strips are in place to remove those and placed new ones.  The incision itself looks good.  Her calf is soft.  I will see her back next week for repeat aspiration if needed.  If the swelling subsides she will be scheduled to see [place] in another few weeks after that.  I will send in some more oxycodone today and Mobic per her request.  She will stop her aspirin as well.  All questions and concerns were answered and addressed.

## 2020-10-30 ENCOUNTER — Ambulatory Visit: Payer: Medicaid Other | Admitting: Orthopaedic Surgery

## 2020-11-02 ENCOUNTER — Inpatient Hospital Stay: Payer: BC Managed Care – PPO | Admitting: Orthopaedic Surgery

## 2020-11-07 DIAGNOSIS — M25531 Pain in right wrist: Secondary | ICD-10-CM | POA: Diagnosis not present

## 2020-11-07 DIAGNOSIS — S63591A Other specified sprain of right wrist, initial encounter: Secondary | ICD-10-CM | POA: Diagnosis not present

## 2020-11-10 DIAGNOSIS — Z419 Encounter for procedure for purposes other than remedying health state, unspecified: Secondary | ICD-10-CM | POA: Diagnosis not present

## 2020-11-21 IMAGING — RF DG C-ARM 1-60 MIN-NO REPORT
1 series · 3 of 3 positions shown · non-contrast
Comparison: 09/13/2019.

CLINICAL DATA: Portable operative imaging provided for right total
hip arthroplasty.

EXAM:
OPERATIVE RIGHT HIP (WITH PELVIS IF PERFORMED) 3 VIEWS
TECHNIQUE: Fluoroscopic spot image(s) were submitted for interpretation
post-operatively.

[Series 1: unknown protocol · 0.20mm/px · 3 of 3 slices shown]
[im 1/3]
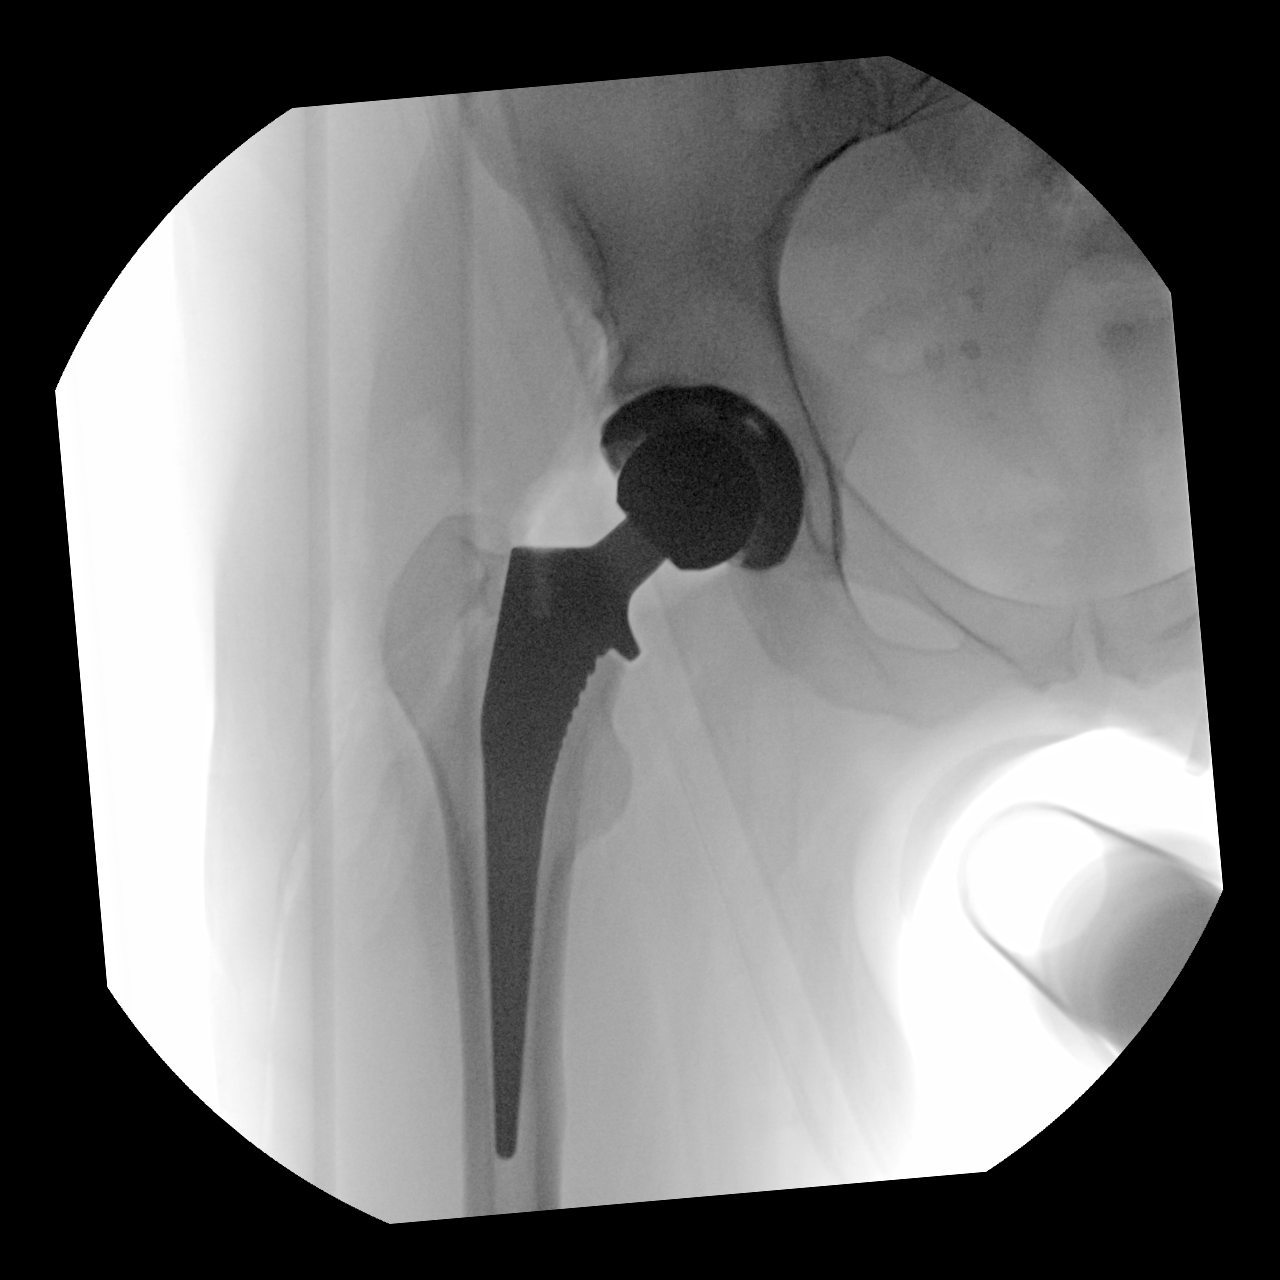
[im 2/3]
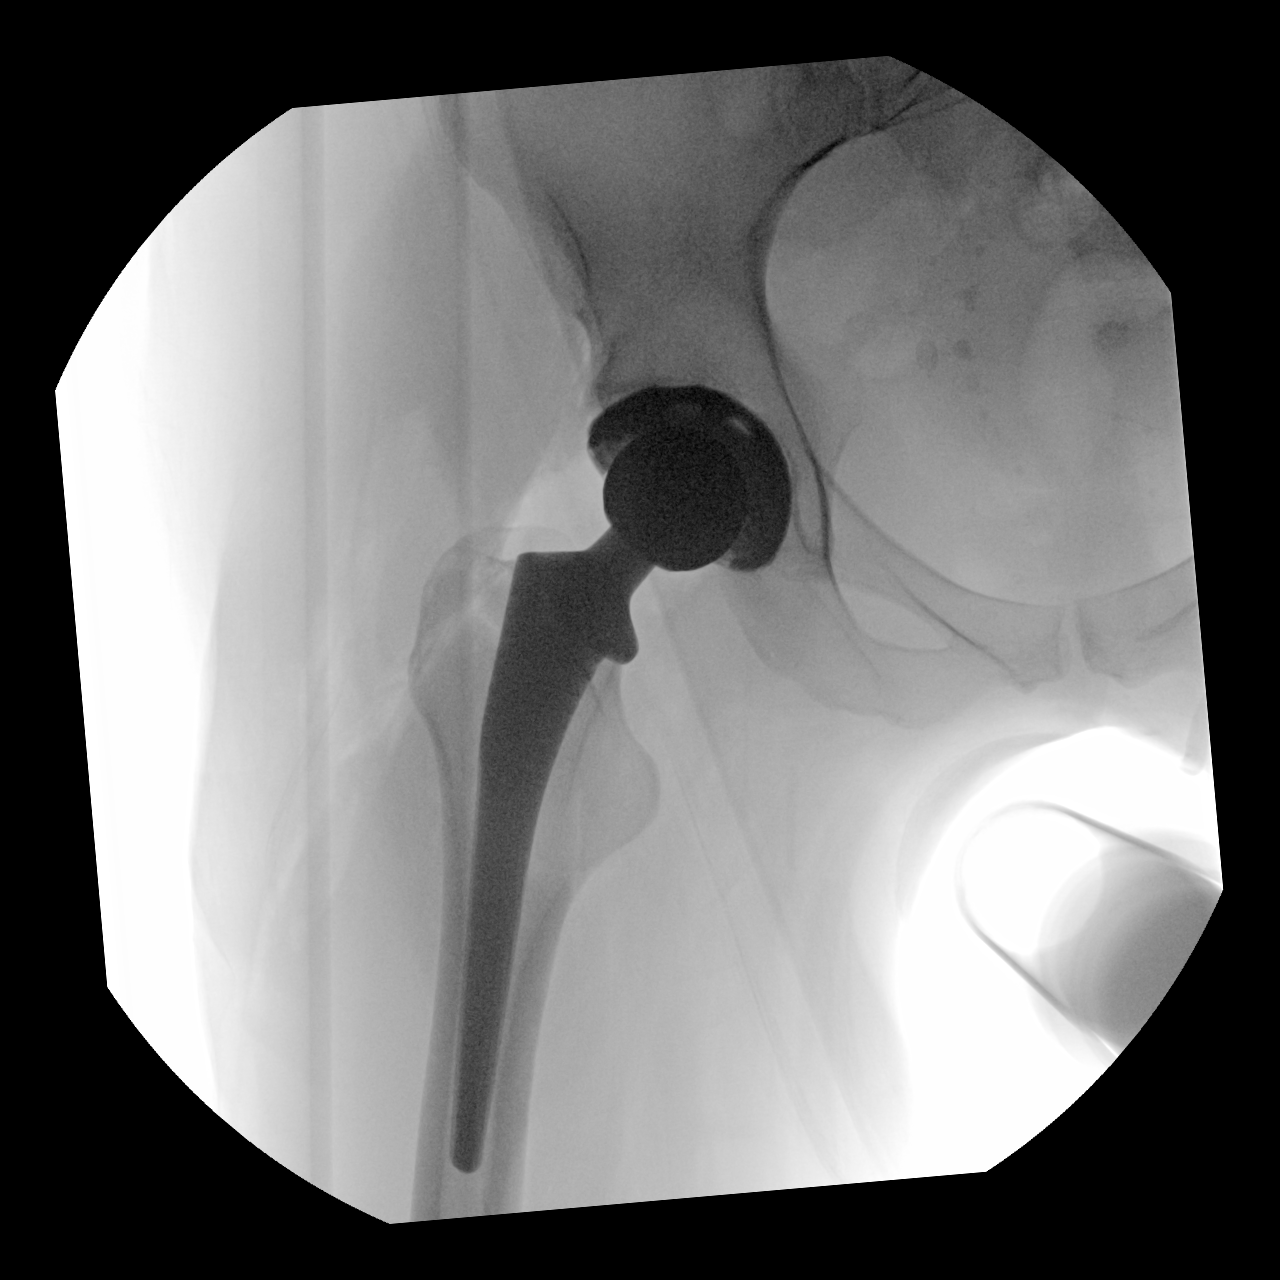
[im 3/3]
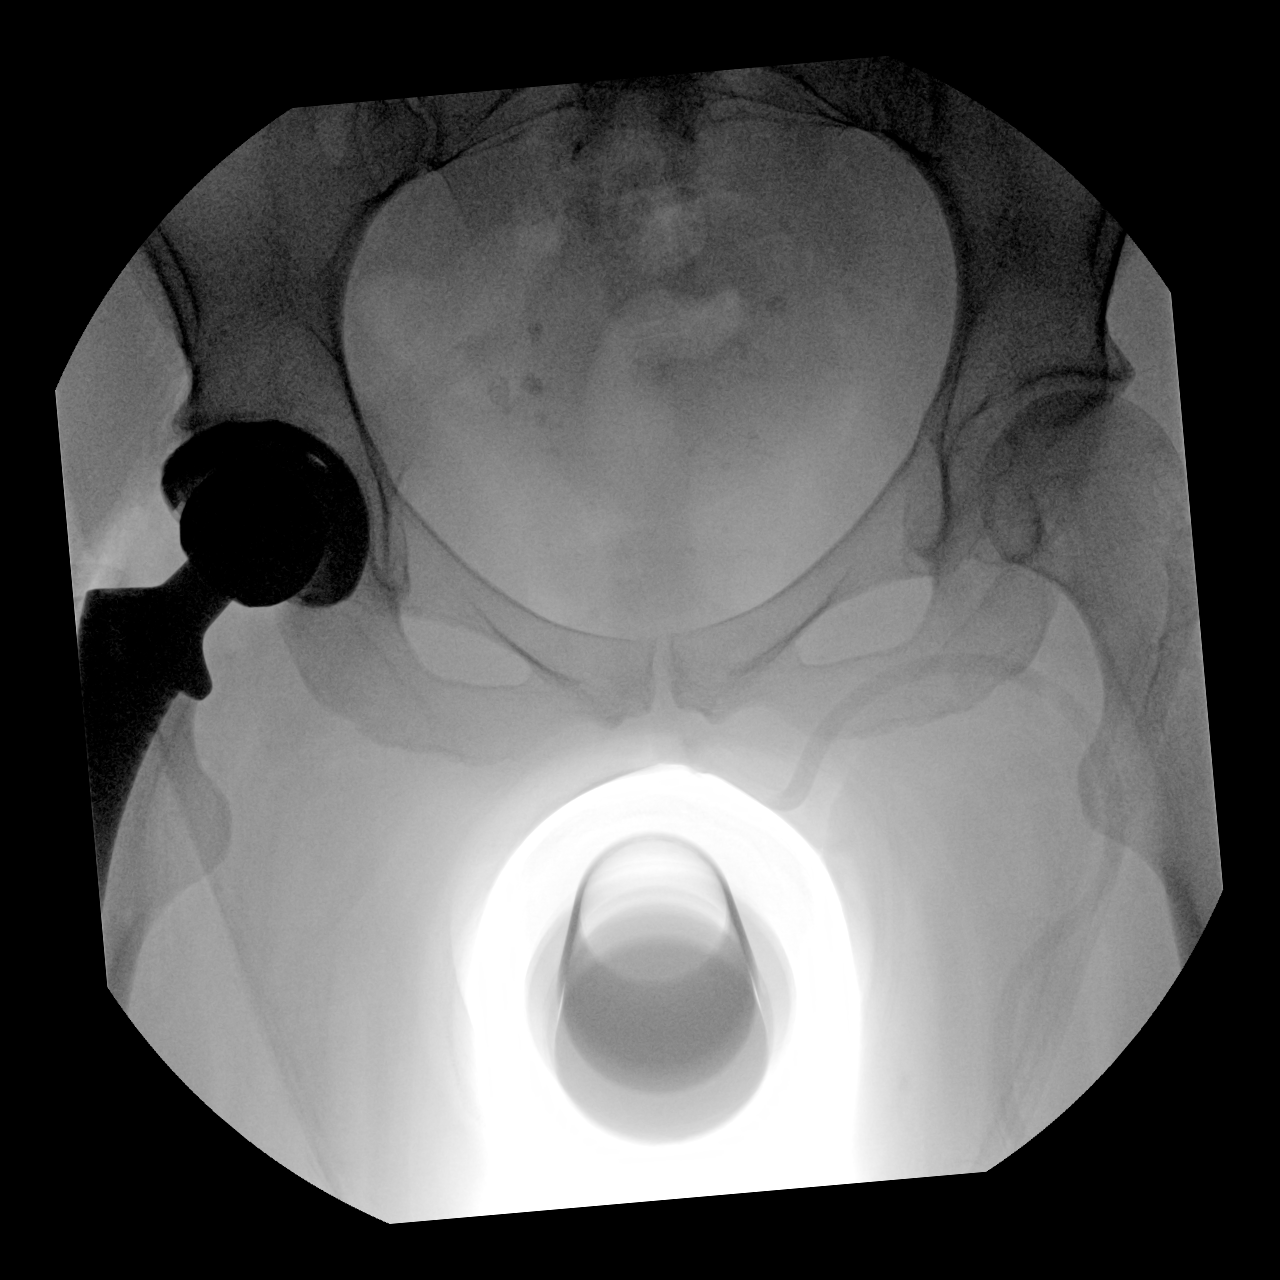

[3 of 3 positions shown; findings below may reference images not displayed]

FINDINGS: The femoral and acetabular components of the total right hip
arthroplasty appear well seated and well aligned. No acute fracture
or evidence of an operative complication.
IMPRESSION: Well-positioned total right hip arthroplasty.

## 2020-11-21 IMAGING — DX DG PORTABLE PELVIS
1 series · 1 of 1 positions shown · non-contrast
Comparison: September 13, 2019

CLINICAL DATA: Postop

EXAM:
PORTABLE PELVIS 1-2 VIEWS

[pelvis ap]
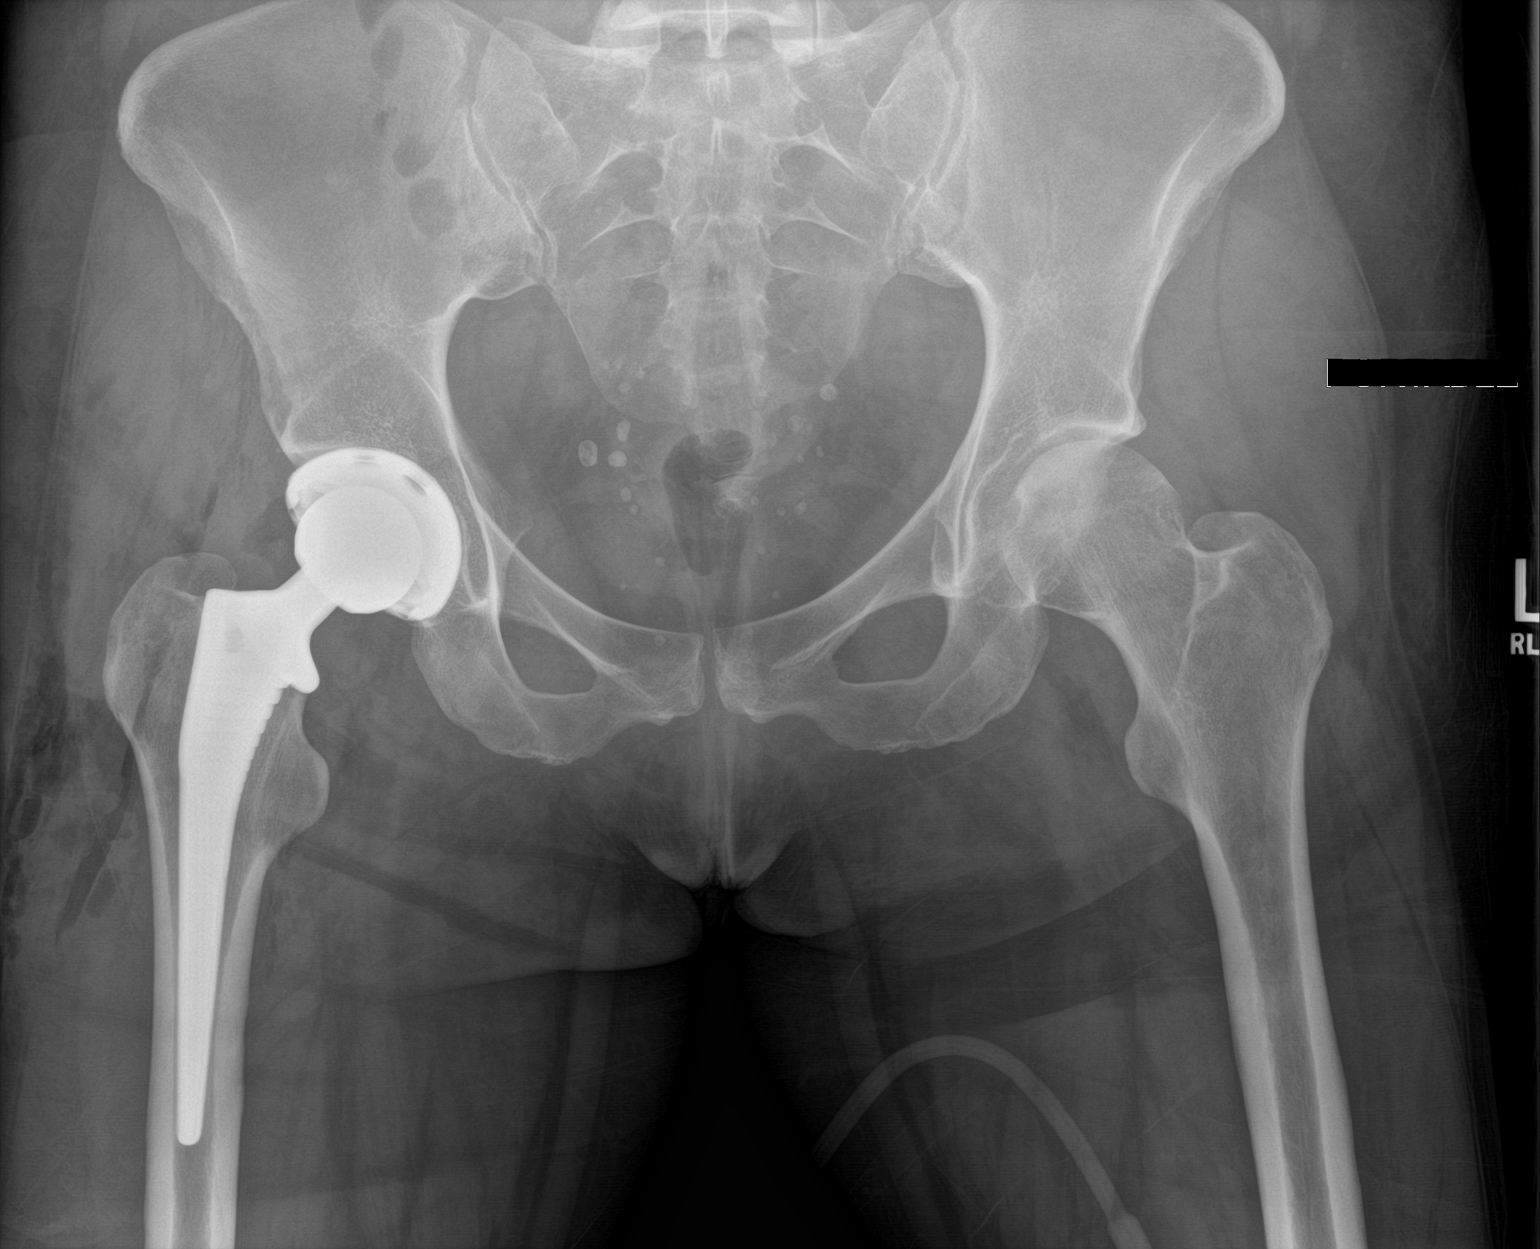

[1 of 1 positions shown; findings below may reference images not displayed]

FINDINGS: Status post RIGHT hip arthroplasty. Orthopedic hardware is intact
and without periprosthetic fracture or lucency. Pelvic phleboliths.
Subcutaneous air within the RIGHT proximal thigh consistent with
recent surgical intervention.
IMPRESSION: Expected postsurgical appearance status post RIGHT hip arthroplasty.

## 2020-12-11 DIAGNOSIS — Z419 Encounter for procedure for purposes other than remedying health state, unspecified: Secondary | ICD-10-CM | POA: Diagnosis not present

## 2020-12-26 DIAGNOSIS — Z20822 Contact with and (suspected) exposure to covid-19: Secondary | ICD-10-CM | POA: Diagnosis not present

## 2021-01-11 DIAGNOSIS — Z419 Encounter for procedure for purposes other than remedying health state, unspecified: Secondary | ICD-10-CM | POA: Diagnosis not present

## 2021-02-10 DIAGNOSIS — Z419 Encounter for procedure for purposes other than remedying health state, unspecified: Secondary | ICD-10-CM | POA: Diagnosis not present

## 2021-03-13 DIAGNOSIS — Z419 Encounter for procedure for purposes other than remedying health state, unspecified: Secondary | ICD-10-CM | POA: Diagnosis not present

## 2021-03-14 ENCOUNTER — Other Ambulatory Visit: Payer: Self-pay

## 2021-03-14 ENCOUNTER — Ambulatory Visit (INDEPENDENT_AMBULATORY_CARE_PROVIDER_SITE_OTHER): Payer: Medicaid Other | Admitting: Orthopaedic Surgery

## 2021-03-14 ENCOUNTER — Ambulatory Visit (INDEPENDENT_AMBULATORY_CARE_PROVIDER_SITE_OTHER): Payer: Medicaid Other

## 2021-03-14 DIAGNOSIS — G8929 Other chronic pain: Secondary | ICD-10-CM

## 2021-03-14 DIAGNOSIS — M25562 Pain in left knee: Secondary | ICD-10-CM

## 2021-03-14 DIAGNOSIS — Z96643 Presence of artificial hip joint, bilateral: Secondary | ICD-10-CM | POA: Diagnosis not present

## 2021-03-14 DIAGNOSIS — Z96641 Presence of right artificial hip joint: Secondary | ICD-10-CM

## 2021-03-14 DIAGNOSIS — Z96642 Presence of left artificial hip joint: Secondary | ICD-10-CM | POA: Diagnosis not present

## 2021-03-14 DIAGNOSIS — M25551 Pain in right hip: Secondary | ICD-10-CM

## 2021-03-14 MED ORDER — TIZANIDINE HCL 4 MG PO TABS: 4.0000 mg | ORAL_TABLET | Freq: Three times a day (TID) | ORAL | 1 refills | Status: DC | PRN

## 2021-03-14 MED ORDER — PREDNISONE 50 MG PO TABS: ORAL_TABLET | ORAL | 0 refills | Status: DC

## 2021-03-14 MED ORDER — CELECOXIB 200 MG PO CAPS: 200.0000 mg | ORAL_CAPSULE | Freq: Two times a day (BID) | ORAL | 2 refills | Status: AC | PRN

## 2021-03-14 NOTE — Progress Notes (Signed)
The patient is very well-known to me.  We have actually replaced both of her hips for avascular necrosis.  She has been dealing with chronic left knee pain for some time and has a significant bony prominence over the medial aspect of her right knee around a previous MCL injury.  She is a very thin individual and very active.  In August of this year she fell moving boxes on her right side and she is having a lot of right hip and groin pain.  It has been waking her up from her sleep as well.  She is still dealing with significant left knee pain as well.  On examination both hips move smoothly and fluidly in terms of rotation.  When I flex her right hip she does experience pain on the anterior hip suggesting symptoms as a relates to the iliopsoas tendon.  Her left knee continues to have significant pain over the medial collateral ligament area and a bony prominence in this area.  An AP pelvis and lateral both hips today shows well-seated total hip arthroplasties with no complicating features.  There are bone ingrown.  Previous examination and x-ray of the left knee showed prominence of bone of the medial femoral condyle.  At this point I would like to obtain an MRI of her left knee to assess the MCL given the nature of her pain over this area and the prominence of the bone.  I will also put her on a steroid for 6 days and a muscle relaxant and Celebrex twice daily to see if this will help with her right hip symptoms.  We will see her back in follow-up to go over the MRI of her left knee.

## 2021-03-15 ENCOUNTER — Other Ambulatory Visit: Payer: Self-pay

## 2021-03-15 DIAGNOSIS — G8929 Other chronic pain: Secondary | ICD-10-CM

## 2021-03-15 DIAGNOSIS — M25562 Pain in left knee: Secondary | ICD-10-CM

## 2021-03-22 DIAGNOSIS — R109 Unspecified abdominal pain: Secondary | ICD-10-CM | POA: Diagnosis not present

## 2021-03-22 DIAGNOSIS — K529 Noninfective gastroenteritis and colitis, unspecified: Secondary | ICD-10-CM | POA: Diagnosis not present

## 2021-03-22 DIAGNOSIS — R933 Abnormal findings on diagnostic imaging of other parts of digestive tract: Secondary | ICD-10-CM | POA: Diagnosis not present

## 2021-03-23 ENCOUNTER — Telehealth: Payer: Self-pay

## 2021-03-23 NOTE — Telephone Encounter (Signed)
Transition Care Management Unsuccessful Follow-up Telephone Call  Date of discharge and from where:  03/22/2021-[facility]  Attempts:  1st Attempt  Reason for unsuccessful TCM follow-up call:  Left voice message

## 2021-03-26 NOTE — Telephone Encounter (Signed)
Transition Care Management Unsuccessful Follow-up Telephone Call  Date of discharge and from where:  03/22/2021 from [facility]  Attempts:  2nd Attempt  Reason for unsuccessful TCM follow-up call:  Left voice message

## 2021-03-27 ENCOUNTER — Other Ambulatory Visit: Payer: Self-pay

## 2021-03-27 DIAGNOSIS — G8929 Other chronic pain: Secondary | ICD-10-CM

## 2021-03-27 NOTE — Telephone Encounter (Signed)
Transition Care Management Unsuccessful Follow-up Telephone Call  Date of discharge and from where:  03/22/2021 from [facility]  Attempts:  3rd Attempt  Reason for unsuccessful TCM follow-up call:  Unable to reach patient

## 2021-03-28 ENCOUNTER — Ambulatory Visit: Payer: Medicaid Other | Admitting: Orthopaedic Surgery

## 2021-04-02 ENCOUNTER — Other Ambulatory Visit: Payer: Medicaid Other

## 2021-04-04 ENCOUNTER — Telehealth: Payer: Self-pay

## 2021-04-04 DIAGNOSIS — G8929 Other chronic pain: Secondary | ICD-10-CM

## 2021-04-04 NOTE — Addendum Note (Signed)
Addended by: Robyne Peers on: 04/04/2021 04:34 PM   Modules accepted: Orders

## 2021-04-04 NOTE — Telephone Encounter (Signed)
Referral placed in chart  

## 2021-04-04 NOTE — Telephone Encounter (Signed)
I called and advised pt and she was ok with this plan

## 2021-04-04 NOTE — Telephone Encounter (Signed)
I called and talked to the pt about her CT and MRI denial. She was wondering if a ultrasound of the knee would be beneficial prior to coming back in. Please advise

## 2021-04-10 DIAGNOSIS — R748 Abnormal levels of other serum enzymes: Secondary | ICD-10-CM | POA: Diagnosis not present

## 2021-04-10 DIAGNOSIS — E559 Vitamin D deficiency, unspecified: Secondary | ICD-10-CM | POA: Diagnosis not present

## 2021-04-10 DIAGNOSIS — M542 Cervicalgia: Secondary | ICD-10-CM | POA: Diagnosis not present

## 2021-04-10 DIAGNOSIS — K580 Irritable bowel syndrome with diarrhea: Secondary | ICD-10-CM | POA: Diagnosis not present

## 2021-04-10 DIAGNOSIS — E7849 Other hyperlipidemia: Secondary | ICD-10-CM | POA: Diagnosis not present

## 2021-04-10 DIAGNOSIS — F17210 Nicotine dependence, cigarettes, uncomplicated: Secondary | ICD-10-CM | POA: Diagnosis not present

## 2021-04-10 DIAGNOSIS — E538 Deficiency of other specified B group vitamins: Secondary | ICD-10-CM | POA: Diagnosis not present

## 2021-04-10 DIAGNOSIS — G43109 Migraine with aura, not intractable, without status migrainosus: Secondary | ICD-10-CM | POA: Diagnosis not present

## 2021-04-10 DIAGNOSIS — M4802 Spinal stenosis, cervical region: Secondary | ICD-10-CM | POA: Diagnosis not present

## 2021-04-10 DIAGNOSIS — Z23 Encounter for immunization: Secondary | ICD-10-CM | POA: Diagnosis not present

## 2021-04-12 DIAGNOSIS — Z419 Encounter for procedure for purposes other than remedying health state, unspecified: Secondary | ICD-10-CM | POA: Diagnosis not present

## 2021-05-13 DIAGNOSIS — Z419 Encounter for procedure for purposes other than remedying health state, unspecified: Secondary | ICD-10-CM | POA: Diagnosis not present

## 2021-05-29 DIAGNOSIS — Z3200 Encounter for pregnancy test, result unknown: Secondary | ICD-10-CM | POA: Diagnosis not present

## 2021-05-29 DIAGNOSIS — N915 Oligomenorrhea, unspecified: Secondary | ICD-10-CM | POA: Diagnosis not present

## 2021-05-29 DIAGNOSIS — Z8759 Personal history of other complications of pregnancy, childbirth and the puerperium: Secondary | ICD-10-CM | POA: Diagnosis not present

## 2021-06-13 ENCOUNTER — Encounter: Payer: Self-pay | Admitting: Cardiology

## 2021-06-13 DIAGNOSIS — Z419 Encounter for procedure for purposes other than remedying health state, unspecified: Secondary | ICD-10-CM | POA: Diagnosis not present

## 2021-06-13 DIAGNOSIS — O019 Hydatidiform mole, unspecified: Secondary | ICD-10-CM | POA: Diagnosis not present

## 2021-06-13 DIAGNOSIS — Z9851 Tubal ligation status: Secondary | ICD-10-CM | POA: Diagnosis not present

## 2021-06-13 DIAGNOSIS — O3680X0 Pregnancy with inconclusive fetal viability, not applicable or unspecified: Secondary | ICD-10-CM | POA: Diagnosis not present

## 2021-06-13 DIAGNOSIS — Z9889 Other specified postprocedural states: Secondary | ICD-10-CM | POA: Diagnosis not present

## 2021-06-13 DIAGNOSIS — R7989 Other specified abnormal findings of blood chemistry: Secondary | ICD-10-CM | POA: Diagnosis not present

## 2021-06-13 NOTE — Telephone Encounter (Signed)
Left message for patient to call back to schedule for soonest available with Dr. Harriet Masson.

## 2021-06-14 DIAGNOSIS — R7989 Other specified abnormal findings of blood chemistry: Secondary | ICD-10-CM | POA: Diagnosis not present

## 2021-06-22 DIAGNOSIS — Z3A01 Less than 8 weeks gestation of pregnancy: Secondary | ICD-10-CM | POA: Diagnosis not present

## 2021-06-22 DIAGNOSIS — O0991 Supervision of high risk pregnancy, unspecified, first trimester: Secondary | ICD-10-CM | POA: Diagnosis not present

## 2021-06-22 DIAGNOSIS — O3680X0 Pregnancy with inconclusive fetal viability, not applicable or unspecified: Secondary | ICD-10-CM | POA: Diagnosis not present

## 2021-06-22 DIAGNOSIS — Z3481 Encounter for supervision of other normal pregnancy, first trimester: Secondary | ICD-10-CM | POA: Diagnosis not present

## 2021-06-22 DIAGNOSIS — Z113 Encounter for screening for infections with a predominantly sexual mode of transmission: Secondary | ICD-10-CM | POA: Diagnosis not present

## 2021-07-05 DIAGNOSIS — O200 Threatened abortion: Secondary | ICD-10-CM | POA: Diagnosis not present

## 2021-07-05 DIAGNOSIS — N939 Abnormal uterine and vaginal bleeding, unspecified: Secondary | ICD-10-CM | POA: Diagnosis not present

## 2021-07-05 DIAGNOSIS — O0990 Supervision of high risk pregnancy, unspecified, unspecified trimester: Secondary | ICD-10-CM | POA: Diagnosis not present

## 2021-07-05 DIAGNOSIS — Z7251 High risk heterosexual behavior: Secondary | ICD-10-CM | POA: Diagnosis not present

## 2021-07-11 DIAGNOSIS — Z419 Encounter for procedure for purposes other than remedying health state, unspecified: Secondary | ICD-10-CM | POA: Diagnosis not present

## 2021-07-24 ENCOUNTER — Ambulatory Visit: Payer: Medicaid Other | Admitting: Cardiology

## 2021-07-27 DIAGNOSIS — O09521 Supervision of elderly multigravida, first trimester: Secondary | ICD-10-CM | POA: Diagnosis not present

## 2021-07-27 DIAGNOSIS — O0991 Supervision of high risk pregnancy, unspecified, first trimester: Secondary | ICD-10-CM | POA: Diagnosis not present

## 2021-08-02 ENCOUNTER — Ambulatory Visit: Payer: Medicaid Other | Admitting: Cardiology

## 2021-08-02 ENCOUNTER — Encounter: Payer: Self-pay | Admitting: Cardiology

## 2021-08-06 ENCOUNTER — Encounter: Payer: Self-pay | Admitting: Cardiology

## 2021-08-11 DIAGNOSIS — Z419 Encounter for procedure for purposes other than remedying health state, unspecified: Secondary | ICD-10-CM | POA: Diagnosis not present

## 2021-08-15 DIAGNOSIS — M25531 Pain in right wrist: Secondary | ICD-10-CM | POA: Diagnosis not present

## 2021-08-24 ENCOUNTER — Ambulatory Visit: Payer: Medicaid Other | Admitting: Cardiology

## 2021-09-03 ENCOUNTER — Ambulatory Visit (INDEPENDENT_AMBULATORY_CARE_PROVIDER_SITE_OTHER): Payer: Medicaid Other | Admitting: Cardiology

## 2021-09-03 ENCOUNTER — Encounter: Payer: Self-pay | Admitting: Cardiology

## 2021-09-03 VITALS — BP 102/68 | HR 74 | Ht 68.0 in | Wt 161.2 lb

## 2021-09-03 DIAGNOSIS — E782 Mixed hyperlipidemia: Secondary | ICD-10-CM | POA: Diagnosis not present

## 2021-09-03 DIAGNOSIS — Z79899 Other long term (current) drug therapy: Secondary | ICD-10-CM

## 2021-09-03 DIAGNOSIS — R002 Palpitations: Secondary | ICD-10-CM

## 2021-09-03 NOTE — Patient Instructions (Signed)
Medication Instructions:  ?Your physician recommends that you continue on your current medications as directed. Please refer to the Current Medication list given to you today.  ?*If you need a refill on your cardiac medications before your next appointment, please call your pharmacy* ? ? ?Lab Work: ?Your physician recommends that you return for lab work in:  ?TODAY: BMET, [place] ?If you have labs (blood work) drawn today and your tests are completely normal, you will receive your results only by: ?MyChart Message (if you have MyChart) OR ?A paper copy in the mail ?If you have any lab test that is abnormal or we need to change your treatment, we will call you to review the results. ? ? ?Testing/Procedures: ?None ? ? ?Follow-Up: ?At [facility], you and your health needs are our priority.  As part of our continuing mission to provide you with exceptional heart care, we have created designated Provider Care Teams.  These Care Teams include your primary Cardiologist (physician) and Advanced Practice Providers (APPs -  Physician Assistants and Nurse Practitioners) who all work together to provide you with the care you need, when you need it. ? ?We recommend signing up for the patient portal called "MyChart".  Sign up information is provided on this After Visit Summary.  MyChart is used to connect with patients for Virtual Visits (Telemedicine).  Patients are able to view lab/test results, encounter notes, upcoming appointments, etc.  Non-urgent messages can be sent to your provider as well.   ?To learn more about what you can do with MyChart, go to [email or web address].   ? ?Your next appointment:   ?16 week(s) ? ?The format for your next appointment:   ?In Person ? ?Provider:   ?Berniece Salines, DO   ? ? ?Other Instructions ? ? ?Important Information About Sugar ? ? ? ? ?  ?

## 2021-09-03 NOTE — Progress Notes (Signed)
?Cardiology Office Note:   ? ?Date:  09/03/2021  ? ?ID:  Erica Welch, DOB 1986/02/08, MRN [id number] ? ?PCP:  Madelaine Bhat., MD  ?Cardiologist:  Berniece Salines, DO  ?Electrophysiologist:  None  ? ?Referring MD: Madelaine Bhat., MD  ? ?" I am doing well" ? ?History of Present Illness:   ? ?Erica Welch is a 36 y.o. 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  Due to her presyncope episodes I recommended patient see EP who at the time thought that her symptoms were neurally mediated.  We recommended Florinef but the patient was hesitant to start this and secondly her orthopedic surgeon had recommended against it therefore she preferred to continue with nonpharmacologic approaches. ? ?I last saw the patient on August 04, 2020 at that time she was pending surgery.  Cleared the patient for surgery and needed a 1 year follow-up. ? ?She is here today for follow-up visit.  She is pregnant 4-1/2 months.  She was asked to see her cardiologist.  She offers no complaints today.  She tells me other than some lightheadedness she has not had any major episodes. ? ? ?Past Medical History:  ?Diagnosis Date  ? Arthritis   ? Autonomic dysfunction   ? AV nodal tachycardia ([place]) 2017  ? Avascular necrosis of hip, left ([place])   ? Cancer [facility])   ? uterine   ? Cubital tunnel syndrome 2017  ? Dyspareunia due to medical condition in female   ? Dysrhythmia   ? SVT  ? Endometriosis   ? Fibromyalgia   ? GERD (gastroesophageal reflux disease)   ? History of kidney stones   ? History of MRSA infection   ? 2007   ? Hyperlipidemia   ? Hypermobility syndrome   ? IBS (irritable bowel syndrome)   ? Insomnia   ? Memory loss   ? Migraine   ? Occipital neuralgia 2017  ? Orthostatic hypotension   ? Patellofemoral arthralgia of both knees   ? Pneumonia   ? as a baby  ? Right flank pain 2017  ? Rotator cuff tendinitis 2017  ? Vitamin B12  deficiency   ? Vitamin D deficiency   ? ? ?Past Surgical History:  ?Procedure Laterality Date  ? ABDOMINAL SURGERY  2007,2010,2012,2014  ? arthroscopy right knee    ? cervical dics repacement    ? CESAREAN SECTION  03/19/2006  ? CESAREAN SECTION  12/09/2008  ? NASAL SEPTUM SURGERY  2013  ? PELVIC LAPAROSCOPY  2012  ? PORTA CATH INSERTION    ? and removal  ? portacath removal     ? TOTAL HIP ARTHROPLASTY Right 12/24/2019  ? Procedure: RIGHT TOTAL HIP ARTHROPLASTY ANTERIOR APPROACH;  Surgeon: [name], MD;  Location: [facility];  Service: Orthopedics;  Laterality: Right;  ? TOTAL HIP ARTHROPLASTY Left 10/06/2020  ? Procedure: LEFT TOTAL HIP ARTHROPLASTY ANTERIOR APPROACH;  Surgeon: [name], MD;  Location: [facility];  Service: Orthopedics;  Laterality: Left;  ? TUBAL LIGATION N/A   ? and untied  ? tubal ligation reversal     ? TYMPANOPLASTY    ? WISDOM TOOTH EXTRACTION    ? ? ?Current Medications: ?Current Meds  ?Medication Sig  ? acetaminophen (TYLENOL) 500 MG tablet Take 1,000 mg by mouth every 6 (six) hours as needed for headache or moderate pain.  ? albuterol (VENTOLIN HFA) 108 (  90 Base) MCG/ACT inhaler Inhale into the lungs.  ? aspirin 81 MG chewable tablet Chew 1 tablet (81 mg total) by mouth 2 (two) times daily.  ? celecoxib (CELEBREX) 200 MG capsule Take 1 capsule (200 mg total) by mouth 2 (two) times daily as needed.  ? ibuprofen (ADVIL) 200 MG tablet Take 600 mg by mouth every 6 (six) hours as needed for moderate pain or headache.  ? meloxicam (MOBIC) 15 MG tablet Take 1 tablet (15 mg total) by mouth daily as needed for pain.  ? methocarbamol (ROBAXIN) 500 MG tablet Take 1 tablet (500 mg total) by mouth every 6 (six) hours as needed for muscle spasms.  ? omeprazole (PRILOSEC) 20 MG capsule Take 1 capsule by mouth daily.  ? ondansetron (ZOFRAN) 4 MG tablet Take 4 mg by mouth every 8 (eight) hours as needed for nausea or vomiting.  ?  ? ?Allergies:   Latex, Strawberry extract, and Adhesive  [tape]  ? ?Social History  ? ?Socioeconomic History  ? Marital status: Married  ?  Spouse name: Not on file  ? Number of children: Not on file  ? Years of education: Not on file  ? Highest education level: Not on file  ?Occupational History  ? Not on file  ?Tobacco Use  ? Smoking status: Former  ?  Years: 10.00  ?  Types: Cigarettes  ? Smokeless tobacco: Never  ? Tobacco comments:  ?  off and on   ?Vaping Use  ? Vaping Use: Never used  ?Substance and Sexual Activity  ? Alcohol use: Yes  ?  Comment: socially   ? Drug use: Never  ? Sexual activity: Yes  ?  Birth control/protection: None  ?Other Topics Concern  ? Not on file  ?Social History Narrative  ? Not on file  ? ?Social Determinants of Health  ? ?Financial Resource Strain: Not on file  ?Food Insecurity: Not on file  ?Transportation Needs: Not on file  ?Physical Activity: Not on file  ?Stress: Not on file  ?Social Connections: Not on file  ?  ? ?Family History: ?The patient's family history includes Cancer in her mother; Hyperlipidemia in her father; Hypertension in her mother; Thyroid disease in her mother. ? ?ROS:   ?Review of Systems  ?Constitution: Negative for decreased appetite, fever and weight gain.  ?HENT: Negative for congestion, ear discharge, hoarse voice and sore throat.   ?Eyes: Negative for discharge, redness, vision loss in right eye and visual halos.  ?Cardiovascular: Negative for chest pain, dyspnea on exertion, leg swelling, orthopnea and palpitations.  ?Respiratory: Negative for cough, hemoptysis, shortness of breath and snoring.   ?Endocrine: Negative for heat intolerance and polyphagia.  ?Hematologic/Lymphatic: Negative for bleeding problem. Does not bruise/bleed easily.  ?Skin: Negative for flushing, nail changes, rash and suspicious lesions.  ?Musculoskeletal: Negative for arthritis, joint pain, muscle cramps, myalgias, neck pain and stiffness.  ?Gastrointestinal: Negative for abdominal pain, bowel incontinence, diarrhea and excessive  appetite.  ?Genitourinary: Negative for decreased libido, genital sores and incomplete emptying.  ?Neurological: Negative for brief paralysis, focal weakness, headaches and loss of balance.  ?Psychiatric/Behavioral: Negative for altered mental status, depression and suicidal ideas.  ?Allergic/Immunologic: Negative for HIV exposure and persistent infections.  ? ? ?EKGs/Labs/Other Studies Reviewed:   ? ?The following studies were reviewed today: ? ? ?EKG: Sinus rhythm, heart rate 74 bpm. ? ?Zio monitor ?The patient wore the monitor for 14 days starting Oct 04, 2019. ?Indication: Palpitations ?  ?The minimum heart rate was 45  bpm, maximum heart rate was 158 bpm, and average heart rate was 79 bpm. ?Predominant underlying rhythm was Sinus Rhythm. ?  ?Premature atrial complexes were were rare (<1.0%). ?Premature Ventricular complexes were rare (<1.0%),  ?  ?No ventricular tachycardia, no pauses, No AV block, no supraventricular and no atrial fibrillation present. ?1 dairy event noted associated with sinus rhythm.   ?  ?Conclusion: Unremarkable/normal study. ?  ?  ?Echo IMPRESSIONS 10/26/2019 ? 1. Left ventricular ejection fraction, by estimation, is 60 to 65%. The left ventricle has normal function. The left ventricle has no regional wall motion abnormalities. Left ventricular diastolic parameters were  ?normal.  ? 2. Right ventricular systolic function is normal. The right ventricular size is normal.  ? 3. The mitral valve is normal in structure. Trivial mitral valve regurgitation. No evidence of mitral stenosis.  ? 4. The aortic valve is normal in structure. Aortic valve regurgitation is not visualized. No aortic stenosis is present.  ? 5. The inferior vena cava is normal in size with greater than 50% respiratory variability, suggesting right atrial pressure of 3 mmHg.  ?  ? ?Recent Labs: ?10/07/2020: BUN 7; Creatinine, Ser 0.50; Hemoglobin 13.4; Platelets 245; Potassium 4.3; Sodium 136  ?Recent Lipid Panel ?   ?Component  Value Date/Time  ? CHOL 395 (H) 01/18/2020 1027  ? TRIG 155 (H) 01/18/2020 1027  ? HDL 57 01/18/2020 1027  ? CHOLHDL 6.9 (H) 01/18/2020 1027  ? LDLCALC 307 (H) 01/18/2020 1027  ? ? ?Physical Exam:   ? ?VS:  BP 1

## 2021-09-04 LAB — BASIC METABOLIC PANEL
BUN/Creatinine Ratio: 16 (ref 9–23)
BUN: 7 mg/dL (ref 6–20)
CO2: 20 mmol/L (ref 20–29)
Calcium: 8.8 mg/dL (ref 8.7–10.2)
Chloride: 102 mmol/L (ref 96–106)
Creatinine, Ser: 0.45 mg/dL — ABNORMAL LOW (ref 0.57–1.00)
Glucose: 98 mg/dL (ref 70–99)
Potassium: 4.3 mmol/L (ref 3.5–5.2)
Sodium: 138 mmol/L (ref 134–144)
eGFR: 129 mL/min/{1.73_m2} (ref >59)

## 2021-09-04 LAB — MAGNESIUM: Magnesium: 2.1 mg/dL (ref 1.6–2.3)

## 2021-09-07 DIAGNOSIS — Z3482 Encounter for supervision of other normal pregnancy, second trimester: Secondary | ICD-10-CM | POA: Diagnosis not present

## 2021-09-07 DIAGNOSIS — R7989 Other specified abnormal findings of blood chemistry: Secondary | ICD-10-CM | POA: Diagnosis not present

## 2021-09-07 DIAGNOSIS — Z3689 Encounter for other specified antenatal screening: Secondary | ICD-10-CM | POA: Diagnosis not present

## 2021-09-07 DIAGNOSIS — Z3A18 18 weeks gestation of pregnancy: Secondary | ICD-10-CM | POA: Diagnosis not present

## 2021-09-10 DIAGNOSIS — Z419 Encounter for procedure for purposes other than remedying health state, unspecified: Secondary | ICD-10-CM | POA: Diagnosis not present

## 2021-09-24 ENCOUNTER — Encounter: Payer: Self-pay | Admitting: Orthopaedic Surgery

## 2021-09-24 NOTE — Telephone Encounter (Signed)
Please advise 

## 2021-10-11 DIAGNOSIS — Z419 Encounter for procedure for purposes other than remedying health state, unspecified: Secondary | ICD-10-CM | POA: Diagnosis not present

## 2021-10-17 DIAGNOSIS — O0990 Supervision of high risk pregnancy, unspecified, unspecified trimester: Secondary | ICD-10-CM | POA: Diagnosis not present

## 2021-11-10 DIAGNOSIS — Z419 Encounter for procedure for purposes other than remedying health state, unspecified: Secondary | ICD-10-CM | POA: Diagnosis not present

## 2021-11-14 DIAGNOSIS — Z3490 Encounter for supervision of normal pregnancy, unspecified, unspecified trimester: Secondary | ICD-10-CM | POA: Diagnosis not present

## 2021-11-14 DIAGNOSIS — Z3A28 28 weeks gestation of pregnancy: Secondary | ICD-10-CM | POA: Diagnosis not present

## 2021-11-14 DIAGNOSIS — Z23 Encounter for immunization: Secondary | ICD-10-CM | POA: Diagnosis not present

## 2021-11-14 DIAGNOSIS — R7989 Other specified abnormal findings of blood chemistry: Secondary | ICD-10-CM | POA: Diagnosis not present

## 2021-11-14 DIAGNOSIS — Z3689 Encounter for other specified antenatal screening: Secondary | ICD-10-CM | POA: Diagnosis not present

## 2021-11-19 ENCOUNTER — Other Ambulatory Visit: Payer: Self-pay | Admitting: Orthopaedic Surgery

## 2021-11-23 DIAGNOSIS — M654 Radial styloid tenosynovitis [de Quervain]: Secondary | ICD-10-CM | POA: Diagnosis not present

## 2021-11-23 DIAGNOSIS — M67431 Ganglion, right wrist: Secondary | ICD-10-CM | POA: Diagnosis not present

## 2021-11-27 ENCOUNTER — Ambulatory Visit: Payer: Medicaid Other | Admitting: Cardiology

## 2021-12-05 ENCOUNTER — Encounter: Payer: Self-pay | Admitting: Cardiology

## 2021-12-11 DIAGNOSIS — Z419 Encounter for procedure for purposes other than remedying health state, unspecified: Secondary | ICD-10-CM | POA: Diagnosis not present

## 2021-12-26 DIAGNOSIS — R7989 Other specified abnormal findings of blood chemistry: Secondary | ICD-10-CM | POA: Diagnosis not present

## 2022-01-08 DIAGNOSIS — Z3685 Encounter for antenatal screening for Streptococcus B: Secondary | ICD-10-CM | POA: Diagnosis not present

## 2022-01-11 DIAGNOSIS — Z419 Encounter for procedure for purposes other than remedying health state, unspecified: Secondary | ICD-10-CM | POA: Diagnosis not present

## 2022-01-15 DIAGNOSIS — O09523 Supervision of elderly multigravida, third trimester: Secondary | ICD-10-CM | POA: Diagnosis not present

## 2022-01-22 DIAGNOSIS — O99892 Other specified diseases and conditions complicating childbirth: Secondary | ICD-10-CM | POA: Diagnosis not present

## 2022-01-22 DIAGNOSIS — O9952 Diseases of the respiratory system complicating childbirth: Secondary | ICD-10-CM | POA: Diagnosis not present

## 2022-01-22 DIAGNOSIS — O09523 Supervision of elderly multigravida, third trimester: Secondary | ICD-10-CM | POA: Diagnosis not present

## 2022-01-22 DIAGNOSIS — O9942 Diseases of the circulatory system complicating childbirth: Secondary | ICD-10-CM | POA: Diagnosis not present

## 2022-01-22 DIAGNOSIS — O34211 Maternal care for low transverse scar from previous cesarean delivery: Secondary | ICD-10-CM | POA: Diagnosis not present

## 2022-01-22 DIAGNOSIS — Z3A38 38 weeks gestation of pregnancy: Secondary | ICD-10-CM | POA: Diagnosis not present

## 2022-01-22 DIAGNOSIS — J45909 Unspecified asthma, uncomplicated: Secondary | ICD-10-CM | POA: Diagnosis not present

## 2022-01-22 DIAGNOSIS — O9962 Diseases of the digestive system complicating childbirth: Secondary | ICD-10-CM | POA: Diagnosis not present

## 2022-01-22 DIAGNOSIS — O1414 Severe pre-eclampsia complicating childbirth: Secondary | ICD-10-CM | POA: Diagnosis not present

## 2022-01-22 DIAGNOSIS — N736 Female pelvic peritoneal adhesions (postinfective): Secondary | ICD-10-CM | POA: Diagnosis not present

## 2022-01-22 DIAGNOSIS — O99334 Smoking (tobacco) complicating childbirth: Secondary | ICD-10-CM | POA: Diagnosis not present

## 2022-01-22 DIAGNOSIS — O1493 Unspecified pre-eclampsia, third trimester: Secondary | ICD-10-CM | POA: Diagnosis not present

## 2022-01-22 DIAGNOSIS — I471 Supraventricular tachycardia: Secondary | ICD-10-CM | POA: Diagnosis not present

## 2022-01-22 DIAGNOSIS — K219 Gastro-esophageal reflux disease without esophagitis: Secondary | ICD-10-CM | POA: Diagnosis not present

## 2022-01-22 DIAGNOSIS — F17210 Nicotine dependence, cigarettes, uncomplicated: Secondary | ICD-10-CM | POA: Diagnosis not present

## 2022-01-22 DIAGNOSIS — O99354 Diseases of the nervous system complicating childbirth: Secondary | ICD-10-CM | POA: Diagnosis not present

## 2022-02-10 DIAGNOSIS — Z419 Encounter for procedure for purposes other than remedying health state, unspecified: Secondary | ICD-10-CM | POA: Diagnosis not present

## 2022-03-11 ENCOUNTER — Other Ambulatory Visit: Payer: Self-pay | Admitting: Orthopaedic Surgery

## 2022-03-11 DIAGNOSIS — G8929 Other chronic pain: Secondary | ICD-10-CM

## 2022-03-13 DIAGNOSIS — Z1331 Encounter for screening for depression: Secondary | ICD-10-CM | POA: Diagnosis not present

## 2022-03-13 DIAGNOSIS — Z419 Encounter for procedure for purposes other than remedying health state, unspecified: Secondary | ICD-10-CM | POA: Diagnosis not present

## 2022-03-18 DIAGNOSIS — Z30013 Encounter for initial prescription of injectable contraceptive: Secondary | ICD-10-CM | POA: Diagnosis not present

## 2022-04-12 DIAGNOSIS — Z419 Encounter for procedure for purposes other than remedying health state, unspecified: Secondary | ICD-10-CM | POA: Diagnosis not present

## 2022-04-16 DIAGNOSIS — M65312 Trigger thumb, left thumb: Secondary | ICD-10-CM | POA: Diagnosis not present

## 2022-04-16 DIAGNOSIS — M65311 Trigger thumb, right thumb: Secondary | ICD-10-CM | POA: Diagnosis not present

## 2022-05-13 DIAGNOSIS — Z419 Encounter for procedure for purposes other than remedying health state, unspecified: Secondary | ICD-10-CM | POA: Diagnosis not present

## 2022-06-13 DIAGNOSIS — Z419 Encounter for procedure for purposes other than remedying health state, unspecified: Secondary | ICD-10-CM | POA: Diagnosis not present

## 2022-07-12 DIAGNOSIS — Z419 Encounter for procedure for purposes other than remedying health state, unspecified: Secondary | ICD-10-CM | POA: Diagnosis not present

## 2022-08-12 DIAGNOSIS — Z419 Encounter for procedure for purposes other than remedying health state, unspecified: Secondary | ICD-10-CM | POA: Diagnosis not present

## 2022-08-23 DIAGNOSIS — M2240 Chondromalacia patellae, unspecified knee: Secondary | ICD-10-CM | POA: Diagnosis not present

## 2022-08-23 DIAGNOSIS — M542 Cervicalgia: Secondary | ICD-10-CM | POA: Diagnosis not present

## 2022-09-05 DIAGNOSIS — M5412 Radiculopathy, cervical region: Secondary | ICD-10-CM | POA: Diagnosis not present

## 2022-09-05 DIAGNOSIS — Z96698 Presence of other orthopedic joint implants: Secondary | ICD-10-CM | POA: Diagnosis not present

## 2022-09-05 DIAGNOSIS — M542 Cervicalgia: Secondary | ICD-10-CM | POA: Diagnosis not present

## 2022-09-05 DIAGNOSIS — Z9889 Other specified postprocedural states: Secondary | ICD-10-CM | POA: Diagnosis not present

## 2022-09-11 DIAGNOSIS — Z419 Encounter for procedure for purposes other than remedying health state, unspecified: Secondary | ICD-10-CM | POA: Diagnosis not present

## 2022-10-12 DIAGNOSIS — Z419 Encounter for procedure for purposes other than remedying health state, unspecified: Secondary | ICD-10-CM | POA: Diagnosis not present

## 2022-11-11 DIAGNOSIS — Z419 Encounter for procedure for purposes other than remedying health state, unspecified: Secondary | ICD-10-CM | POA: Diagnosis not present

## 2022-12-02 DIAGNOSIS — Z9889 Other specified postprocedural states: Secondary | ICD-10-CM | POA: Diagnosis not present

## 2022-12-02 DIAGNOSIS — M2578 Osteophyte, vertebrae: Secondary | ICD-10-CM | POA: Diagnosis not present

## 2022-12-02 DIAGNOSIS — M4802 Spinal stenosis, cervical region: Secondary | ICD-10-CM | POA: Diagnosis not present

## 2022-12-02 DIAGNOSIS — M47812 Spondylosis without myelopathy or radiculopathy, cervical region: Secondary | ICD-10-CM | POA: Diagnosis not present

## 2022-12-03 DIAGNOSIS — M5412 Radiculopathy, cervical region: Secondary | ICD-10-CM | POA: Diagnosis not present

## 2022-12-03 DIAGNOSIS — Z9889 Other specified postprocedural states: Secondary | ICD-10-CM | POA: Diagnosis not present

## 2022-12-03 DIAGNOSIS — M542 Cervicalgia: Secondary | ICD-10-CM | POA: Diagnosis not present

## 2022-12-12 DIAGNOSIS — M65312 Trigger thumb, left thumb: Secondary | ICD-10-CM | POA: Diagnosis not present

## 2022-12-12 DIAGNOSIS — Z419 Encounter for procedure for purposes other than remedying health state, unspecified: Secondary | ICD-10-CM | POA: Diagnosis not present
